# Patient Record
Sex: Female | Born: 1985
Health system: Southern US, Community
[De-identification: ages and names within clinical notes are randomized; demographics above are authoritative.]

## PROBLEM LIST (undated history)

## (undated) DIAGNOSIS — R87619 Unspecified abnormal cytological findings in specimens from cervix uteri: Secondary | ICD-10-CM

## (undated) DIAGNOSIS — R87629 Unspecified abnormal cytological findings in specimens from vagina: Secondary | ICD-10-CM

## (undated) DIAGNOSIS — IMO0002 Reserved for concepts with insufficient information to code with codable children: Secondary | ICD-10-CM

## (undated) DIAGNOSIS — B999 Unspecified infectious disease: Secondary | ICD-10-CM

## (undated) DIAGNOSIS — N63 Unspecified lump in unspecified breast: Principal | ICD-10-CM

## (undated) HISTORY — DX: Unspecified abnormal cytological findings in specimens from vagina: R87.629

## (undated) HISTORY — DX: Unspecified abnormal cytological findings in specimens from cervix uteri: R87.619

## (undated) HISTORY — DX: Unspecified lump in unspecified breast: N63.0

## (undated) HISTORY — DX: Reserved for concepts with insufficient information to code with codable children: IMO0002

## (undated) HISTORY — PX: NO PAST SURGERIES: SHX2092

---

## 2013-07-04 ENCOUNTER — Other Ambulatory Visit: Payer: Self-pay | Admitting: Obstetrics & Gynecology

## 2013-07-04 DIAGNOSIS — O3680X0 Pregnancy with inconclusive fetal viability, not applicable or unspecified: Secondary | ICD-10-CM

## 2013-07-09 ENCOUNTER — Ambulatory Visit (INDEPENDENT_AMBULATORY_CARE_PROVIDER_SITE_OTHER): Payer: BC Managed Care – PPO

## 2013-07-09 ENCOUNTER — Encounter (INDEPENDENT_AMBULATORY_CARE_PROVIDER_SITE_OTHER): Payer: Self-pay

## 2013-07-09 DIAGNOSIS — O3680X0 Pregnancy with inconclusive fetal viability, not applicable or unspecified: Secondary | ICD-10-CM

## 2013-07-09 NOTE — Progress Notes (Signed)
U/S(7+2wks)-single IUP with +FCA noted, FHR-129 bpm, cx long and closed, bilateral adnexa WNL, CRL c/w LMP dates

## 2013-07-18 ENCOUNTER — Encounter: Payer: BC Managed Care – PPO | Admitting: Advanced Practice Midwife

## 2013-07-24 ENCOUNTER — Encounter: Payer: BC Managed Care – PPO | Admitting: Advanced Practice Midwife

## 2013-07-26 ENCOUNTER — Encounter: Payer: Self-pay | Admitting: Advanced Practice Midwife

## 2013-07-26 ENCOUNTER — Ambulatory Visit (INDEPENDENT_AMBULATORY_CARE_PROVIDER_SITE_OTHER): Payer: BC Managed Care – PPO | Admitting: Advanced Practice Midwife

## 2013-07-26 ENCOUNTER — Other Ambulatory Visit (HOSPITAL_COMMUNITY)
Admission: RE | Admit: 2013-07-26 | Discharge: 2013-07-26 | Disposition: A | Discharge status: Home / Self Care | Payer: Medicaid Other | Source: Ambulatory Visit | Attending: Advanced Practice Midwife | Admitting: Advanced Practice Midwife

## 2013-07-26 VITALS — BP 110/60 | Ht 65.0 in | Wt 211.5 lb

## 2013-07-26 DIAGNOSIS — Z1151 Encounter for screening for human papillomavirus (HPV): Secondary | ICD-10-CM | POA: Insufficient documentation

## 2013-07-26 DIAGNOSIS — Z331 Pregnant state, incidental: Secondary | ICD-10-CM

## 2013-07-26 DIAGNOSIS — Z01419 Encounter for gynecological examination (general) (routine) without abnormal findings: Secondary | ICD-10-CM | POA: Insufficient documentation

## 2013-07-26 DIAGNOSIS — Z3480 Encounter for supervision of other normal pregnancy, unspecified trimester: Secondary | ICD-10-CM | POA: Insufficient documentation

## 2013-07-26 DIAGNOSIS — Z3490 Encounter for supervision of normal pregnancy, unspecified, unspecified trimester: Secondary | ICD-10-CM

## 2013-07-26 DIAGNOSIS — Z1389 Encounter for screening for other disorder: Secondary | ICD-10-CM

## 2013-07-26 DIAGNOSIS — Z3481 Encounter for supervision of other normal pregnancy, first trimester: Secondary | ICD-10-CM

## 2013-07-26 DIAGNOSIS — Z113 Encounter for screening for infections with a predominantly sexual mode of transmission: Secondary | ICD-10-CM | POA: Insufficient documentation

## 2013-07-26 LAB — CBC
MCH: 27.1 pg (ref 26.0–34.0)
MCV: 80 fL (ref 78.0–100.0)
Platelets: 332 10*3/uL (ref 150–400)
RBC: 4.46 MIL/uL (ref 3.87–5.11)
RDW: 15 % (ref 11.5–15.5)
WBC: 12.9 10*3/uL — ABNORMAL HIGH (ref 4.0–10.5)

## 2013-07-26 LAB — POCT URINALYSIS DIPSTICK
Glucose, UA: NEGATIVE
Ketones, UA: NEGATIVE

## 2013-07-26 NOTE — Progress Notes (Signed)
  Subjective:    [name] is a G2P1001 [redacted]w[redacted]d being seen today for her first obstetrical visit.  Her obstetrical history is significant for elective IOL .  Pregnancy history fully reviewed.  Patient reports backache.  Filed Vitals:   07/26/13 1351  BP: 110/60  Weight: 211 lb 8 oz (95.936 kg)    HISTORY: OB History  Gravida Para Term Preterm AB SAB TAB Ectopic Multiple Living  2 1 1       1     # Outcome Date GA Lbr Len/2nd Weight Sex Delivery Anes PTL Lv  2 CUR           1 TRM 01/12/07 [redacted]w[redacted]d  8 lb (3.629 kg) M SVD EPI  Y     Past Medical History  Diagnosis Date  . Abnormal Pap smear    Past Surgical History  Procedure Laterality Date  . No past surgeries     Family History  Problem Relation Age of Onset  . Cancer Paternal Grandmother     breast     Exam       Pelvic Exam:    Perineum: Normal Perineum   Vulva: normal   Vagina:  normal mucosa, normal discharge, no palpable nodules   Uterus         Cervix: normal   Adnexa: Not palpable   Urinary:  urethral meatus normal    System: Breast:  normal appearance, no masses or tenderness   Skin: normal coloration and turgor, no rashes    Neurologic: oriented, normal, normal mood   Extremities: normal strength, tone, and muscle mass   HEENT PERRLA   Mouth/Teeth mucous membranes moist, pharynx normal without lesions   Neck supple and no masses   Cardiovascular: regular rate and rhythm   Respiratory:  appears well, vitals normal, no respiratory distress, acyanotic, normal RR   Abdomen: soft, non-tender; bowel sounds normal; no masses,  no organomegaly          Assessment:    Pregnancy: G2P1001 Patient Active Problem List   Diagnosis Date Noted  . Pregnant 07/26/2013        Plan:     Initial labs drawn. Prenatal vitamins. Problem list reviewed and updated. Genetic Screening discussed Integrated Screen: requested.  Ultrasound discussed; fetal survey: requested.  Follow up in 3  weeks.  CRESENZO-DISHMAN,Talen Poser 07/26/2013

## 2013-07-27 LAB — OXYCODONE SCREEN, UA, RFLX CONFIRM: Oxycodone Screen, Ur: NEGATIVE ng/mL

## 2013-07-27 LAB — URINALYSIS
Glucose, UA: NEGATIVE mg/dL
Leukocytes, UA: NEGATIVE
Specific Gravity, Urine: 1.011 (ref 1.005–1.030)
pH: 6 (ref 5.0–8.0)

## 2013-07-27 LAB — DRUG SCREEN, URINE, NO CONFIRMATION
Amphetamine Screen, Ur: NEGATIVE
Barbiturate Quant, Ur: NEGATIVE
Benzodiazepines.: NEGATIVE
Cocaine Metabolites: NEGATIVE
Creatinine,U: 84.9 mg/dL
Opiate Screen, Urine: NEGATIVE
Propoxyphene: NEGATIVE

## 2013-07-27 LAB — ABO AND RH: Rh Type: POSITIVE

## 2013-07-27 LAB — SICKLE CELL SCREEN: Sickle Cell Screen: NEGATIVE

## 2013-07-27 LAB — ANTIBODY SCREEN: Antibody Screen: NEGATIVE

## 2013-07-27 LAB — HEPATITIS B SURFACE ANTIGEN: Hepatitis B Surface Ag: NEGATIVE

## 2013-07-27 LAB — RPR

## 2013-07-28 LAB — URINE CULTURE: Colony Count: 30000

## 2013-08-07 ENCOUNTER — Ambulatory Visit (INDEPENDENT_AMBULATORY_CARE_PROVIDER_SITE_OTHER): Payer: BC Managed Care – PPO | Admitting: Obstetrics & Gynecology

## 2013-08-07 ENCOUNTER — Encounter: Payer: Self-pay | Admitting: Obstetrics & Gynecology

## 2013-08-07 ENCOUNTER — Telehealth: Payer: Self-pay | Admitting: Obstetrics & Gynecology

## 2013-08-07 VITALS — BP 90/60 | Wt 213.5 lb

## 2013-08-07 DIAGNOSIS — Z331 Pregnant state, incidental: Secondary | ICD-10-CM

## 2013-08-07 DIAGNOSIS — B373 Candidiasis of vulva and vagina: Secondary | ICD-10-CM

## 2013-08-07 DIAGNOSIS — Z1389 Encounter for screening for other disorder: Secondary | ICD-10-CM

## 2013-08-07 DIAGNOSIS — O2390 Unspecified genitourinary tract infection in pregnancy, unspecified trimester: Secondary | ICD-10-CM

## 2013-08-07 DIAGNOSIS — B3731 Acute candidiasis of vulva and vagina: Secondary | ICD-10-CM

## 2013-08-07 LAB — POCT URINALYSIS DIPSTICK
Glucose, UA: NEGATIVE
Protein, UA: NEGATIVE

## 2013-08-07 MED ORDER — TERCONAZOLE 0.4 % VA CREA: 1.0000 | TOPICAL_CREAM | Freq: Every day | VAGINAL | Status: DC

## 2013-08-07 NOTE — Progress Notes (Signed)
+   yeast, has had them before Terazol 7 qhs x 7

## 2013-08-09 NOTE — L&D Delivery Note (Signed)
Delivery Note At 4:13 PM a viable and healthy female was delivered via Vaginal, Spontaneous Delivery (Presentation: Left Occiput Anterior), compound hand.  APGAR: 8, 9; weight pending.   Placenta status: Intact, Spontaneous.  Cord: 3 vessels with the following complications: None.  Cord pH: NA  Anesthesia: Epidural  Episiotomy: None Lacerations: superficial right labial, hemostatic Suture Repair: NA Est. Blood Loss (mL): 250 ml  Mom to postpartum.  Baby to Couplet care / Skin to Skin. Placenta to: BS Feeding: Br/Bo Circ: NA Contraception: Adrian ProwsMirena  Emanuel Dowson, Koleen NimrodVIRGINIA 01/20/2014, 4:33 PM

## 2013-08-16 ENCOUNTER — Other Ambulatory Visit: Payer: Self-pay | Admitting: Advanced Practice Midwife

## 2013-08-16 ENCOUNTER — Encounter (INDEPENDENT_AMBULATORY_CARE_PROVIDER_SITE_OTHER): Payer: Self-pay

## 2013-08-16 ENCOUNTER — Ambulatory Visit (INDEPENDENT_AMBULATORY_CARE_PROVIDER_SITE_OTHER): Payer: BC Managed Care – PPO | Admitting: Advanced Practice Midwife

## 2013-08-16 ENCOUNTER — Ambulatory Visit (INDEPENDENT_AMBULATORY_CARE_PROVIDER_SITE_OTHER): Payer: BC Managed Care – PPO

## 2013-08-16 ENCOUNTER — Encounter: Payer: Self-pay | Admitting: Advanced Practice Midwife

## 2013-08-16 VITALS — BP 116/60 | Wt 212.0 lb

## 2013-08-16 DIAGNOSIS — Z1389 Encounter for screening for other disorder: Secondary | ICD-10-CM

## 2013-08-16 DIAGNOSIS — Z3481 Encounter for supervision of other normal pregnancy, first trimester: Secondary | ICD-10-CM

## 2013-08-16 DIAGNOSIS — Z331 Pregnant state, incidental: Secondary | ICD-10-CM

## 2013-08-16 DIAGNOSIS — Z3480 Encounter for supervision of other normal pregnancy, unspecified trimester: Secondary | ICD-10-CM

## 2013-08-16 DIAGNOSIS — Z3490 Encounter for supervision of normal pregnancy, unspecified, unspecified trimester: Secondary | ICD-10-CM

## 2013-08-16 DIAGNOSIS — Z36 Encounter for antenatal screening of mother: Secondary | ICD-10-CM

## 2013-08-16 LAB — POCT URINALYSIS DIPSTICK
Glucose, UA: NEGATIVE
Ketones, UA: NEGATIVE
LEUKOCYTES UA: NEGATIVE
Nitrite, UA: NEGATIVE
PROTEIN UA: NEGATIVE

## 2013-08-16 NOTE — Progress Notes (Signed)
U/S(12+5wks)-single IUP with +FCA noted, FHR-138 bpm, cx appears long and closed (3.5cm), bilateral adnexa appears WNL, CRL c/w dates, NB present, NT-1.8347mm, posterior Gr 0 placenta noted

## 2013-08-16 NOTE — Progress Notes (Signed)
Had NT/IT today.    No c/o at this time.  Routine questions about pregnancy answered.  F/U in 4 weeks for LROB/2nd IT.

## 2013-08-21 LAB — MATERNAL SCREEN, INTEGRATED #1

## 2013-09-03 ENCOUNTER — Telehealth: Payer: Self-pay | Admitting: Obstetrics & Gynecology

## 2013-09-03 MED ORDER — PRENATAL PLUS 27-1 MG PO TABS: ORAL_TABLET | ORAL | Status: DC

## 2013-09-03 NOTE — Telephone Encounter (Signed)
Pt informed prenatal vitamins e-scribed.

## 2013-09-12 ENCOUNTER — Ambulatory Visit (INDEPENDENT_AMBULATORY_CARE_PROVIDER_SITE_OTHER): Payer: BC Managed Care – PPO | Admitting: Women's Health

## 2013-09-12 ENCOUNTER — Encounter: Payer: Self-pay | Admitting: Women's Health

## 2013-09-12 ENCOUNTER — Encounter (INDEPENDENT_AMBULATORY_CARE_PROVIDER_SITE_OTHER): Payer: Self-pay

## 2013-09-12 ENCOUNTER — Other Ambulatory Visit: Payer: Self-pay | Admitting: Obstetrics & Gynecology

## 2013-09-12 VITALS — BP 126/62 | Wt 214.0 lb

## 2013-09-12 DIAGNOSIS — Z1389 Encounter for screening for other disorder: Secondary | ICD-10-CM

## 2013-09-12 DIAGNOSIS — Z3480 Encounter for supervision of other normal pregnancy, unspecified trimester: Secondary | ICD-10-CM

## 2013-09-12 DIAGNOSIS — Z331 Pregnant state, incidental: Secondary | ICD-10-CM

## 2013-09-12 LAB — POCT URINALYSIS DIPSTICK
Glucose, UA: NEGATIVE
Ketones, UA: NEGATIVE
Leukocytes, UA: NEGATIVE
NITRITE UA: NEGATIVE
Protein, UA: NEGATIVE
RBC UA: NEGATIVE

## 2013-09-12 NOTE — Patient Instructions (Signed)
[place] Pediatricians:  [facility] [phone]            [facility] [phone]                 [facility] [phone] (usually doesn't accept new patients unless you have family there already, you are always welcome to call and ask)             [facility] ([address]) [phone]   [facility] Pediatricians:   [facility]: [phone]  [facility]: [phone]   Second Trimester of Pregnancy The second trimester is from week 13 through week 28, months 4 through 6. The second trimester is often a time when you feel your best. Your body has also adjusted to being pregnant, and you begin to feel better physically. Usually, morning sickness has lessened or quit completely, you may have more energy, and you may have an increase in appetite. The second trimester is also a time when the fetus is growing rapidly. At the end of the sixth month, the fetus is about 9 inches long and weighs about 1 pounds. You will likely begin to feel the baby move (quickening) between 18 and 20 weeks of the pregnancy. BODY CHANGES Your body goes through many changes during pregnancy. The changes vary from woman to woman.   Your weight will continue to increase. You will notice your lower abdomen bulging out.  You may begin to get stretch marks on your hips, abdomen, and breasts.  You may develop headaches that can be relieved by medicines approved by your caregiver.  You may urinate more often because the fetus is pressing on your bladder.  You may develop or continue to have heartburn as a result of your pregnancy.  You may develop constipation because certain hormones are causing the muscles that push waste through your intestines to slow down.  You may develop hemorrhoids or swollen, bulging veins (varicose veins).  You may have back pain because of the weight gain and pregnancy  hormones relaxing your joints between the bones in your pelvis and as a result of a shift in weight and the muscles that support your balance.  Your breasts will continue to grow and be tender.  Your gums may bleed and may be sensitive to brushing and flossing.  Dark spots or blotches (chloasma, mask of pregnancy) may develop on your face. This will likely fade after the baby is born.  A dark line from your belly button to the pubic area (linea nigra) may appear. This will likely fade after the baby is born. WHAT TO EXPECT AT YOUR PRENATAL VISITS During a routine prenatal visit:  You will be weighed to make sure you and the fetus are growing normally.  Your blood pressure will be taken.  Your abdomen will be measured to track your baby's growth.  The fetal heartbeat will be listened to.  Any test results from the previous visit will be discussed. Your caregiver may ask you:  How you are feeling.  If you are feeling the baby move.  If you have had any abnormal symptoms, such as leaking fluid, bleeding, severe headaches, or abdominal cramping.  If you have any questions. Other tests that may be performed during your second trimester include:  Blood tests that check for:  Low iron levels (anemia).  Gestational diabetes (between 24 and 28 weeks).  Rh antibodies.  Urine tests to check for infections, diabetes, or protein  in the urine.  An ultrasound to confirm the proper growth and development of the baby.  An amniocentesis to check for possible genetic problems.  Fetal screens for spina bifida and Down syndrome. HOME CARE INSTRUCTIONS   Avoid all smoking, herbs, alcohol, and unprescribed drugs. These chemicals affect the formation and growth of the baby.  Follow your caregiver's instructions regarding medicine use. There are medicines that are either safe or unsafe to take during pregnancy.  Exercise only as directed by your caregiver. Experiencing uterine cramps is a  good sign to stop exercising.  Continue to eat regular, healthy meals.  Wear a good support bra for breast tenderness.  Do not use hot tubs, steam rooms, or saunas.  Wear your seat belt at all times when driving.  Avoid raw meat, uncooked cheese, cat litter boxes, and soil used by cats. These carry germs that can cause birth defects in the baby.  Take your prenatal vitamins.  Try taking a stool softener (if your caregiver approves) if you develop constipation. Eat more high-fiber foods, such as fresh vegetables or fruit and whole grains. Drink plenty of fluids to keep your urine clear or pale yellow.  Take warm sitz baths to soothe any pain or discomfort caused by hemorrhoids. Use hemorrhoid cream if your caregiver approves.  If you develop varicose veins, wear support hose. Elevate your feet for 15 minutes, 3 4 times a day. Limit salt in your diet.  Avoid heavy lifting, wear low heel shoes, and practice good posture.  Rest with your legs elevated if you have leg cramps or low back pain.  Visit your dentist if you have not gone yet during your pregnancy. Use a soft toothbrush to brush your teeth and be gentle when you floss.  A sexual relationship may be continued unless your caregiver directs you otherwise.  Continue to go to all your prenatal visits as directed by your caregiver. SEEK MEDICAL CARE IF:   You have dizziness.  You have mild pelvic cramps, pelvic pressure, or nagging pain in the abdominal area.  You have persistent nausea, vomiting, or diarrhea.  You have a bad smelling vaginal discharge.  You have pain with urination. SEEK IMMEDIATE MEDICAL CARE IF:   You have a fever.  You are leaking fluid from your vagina.  You have spotting or bleeding from your vagina.  You have severe abdominal cramping or pain.  You have rapid weight gain or loss.  You have shortness of breath with chest pain.  You notice sudden or extreme swelling of your face, hands,  ankles, feet, or legs.  You have not felt your baby move in over an hour.  You have severe headaches that do not go away with medicine.  You have vision changes. Document Released: 07/20/2001 Document Revised: 03/28/2013 Document Reviewed: 09/26/2012 [facility] Patient Information 2014 [place], [place].

## 2013-09-12 NOTE — Progress Notes (Signed)
Denies cramping, lof, vb, uti s/s.  Feeling some flutters. Daily ha's x 1 week, reviewed prevention/relief measures. To let us know if not helping, or ha's getting worse.  Reviewed warning s/s to report.  All questions answered. F/U in 4wks for anatomy u/s and visit. 2nd IT today.

## 2013-09-15 LAB — MATERNAL SCREEN, INTEGRATED #2
AFP MoM: 1.08
AFP, SERUM MAT SCREEN: 34 ng/mL
Calculated Gestational Age: 16.4
Crown Rump Length: 63.5 mm
Estriol Mom: 0.75
Estriol, Free: 0.6 ng/mL
HCG, MOM MAT SCREEN: 0.53
INHIBIN A MOM MAT SCREEN: 0.62
Inhibin A Dimeric: 89 pg/mL
MSS Down Syndrome: <1:5000 {titer}
MSS Trisomy 18 Risk: <1:5000 {titer}
NT MOM MAT SCREEN: 1.02
Nuchal Translucency: 1.47 mm
Number of fetuses: 1
PAPP-A MoM: 0.32
PAPP-A: 304 ng/mL
Rish for ONTD: <1:5000 {titer}
hCG, Serum: 17 IU/mL

## 2013-10-10 ENCOUNTER — Other Ambulatory Visit: Payer: BC Managed Care – PPO

## 2013-10-10 ENCOUNTER — Encounter: Payer: BC Managed Care – PPO | Admitting: Women's Health

## 2013-10-17 ENCOUNTER — Other Ambulatory Visit: Payer: Self-pay | Admitting: Women's Health

## 2013-10-17 ENCOUNTER — Ambulatory Visit (INDEPENDENT_AMBULATORY_CARE_PROVIDER_SITE_OTHER): Payer: BC Managed Care – PPO

## 2013-10-17 ENCOUNTER — Ambulatory Visit (INDEPENDENT_AMBULATORY_CARE_PROVIDER_SITE_OTHER): Payer: BC Managed Care – PPO | Admitting: Obstetrics and Gynecology

## 2013-10-17 VITALS — BP 120/72 | Wt 218.0 lb

## 2013-10-17 DIAGNOSIS — Z331 Pregnant state, incidental: Secondary | ICD-10-CM

## 2013-10-17 DIAGNOSIS — Z1389 Encounter for screening for other disorder: Secondary | ICD-10-CM

## 2013-10-17 DIAGNOSIS — Z3480 Encounter for supervision of other normal pregnancy, unspecified trimester: Secondary | ICD-10-CM

## 2013-10-17 DIAGNOSIS — Z3490 Encounter for supervision of normal pregnancy, unspecified, unspecified trimester: Secondary | ICD-10-CM

## 2013-10-17 LAB — POCT URINALYSIS DIPSTICK
Blood, UA: NEGATIVE
Glucose, UA: NEGATIVE
Ketones, UA: NEGATIVE
LEUKOCYTES UA: NEGATIVE
NITRITE UA: NEGATIVE
Protein, UA: NEGATIVE

## 2013-10-17 NOTE — Progress Notes (Signed)
2118w4d. G2P1. Fundal height is U+2. No complications.   U/S(21+4wks)-vtx active fetus, meas c/w dates, EFW 15 oz, FHR- 144 bpm, cx appears closed (3.9cm), bilateral adnexa WNL, female fetus, no obvious abnl noted although unable to view the cardiac OFT's due to fetal position, posterior Gr 0 placenta noted, would like to reck anatomy @ ~28 weeks  This chart was scribed by Bennett Scrapehristina Taylor, Medical Scribe, for Dr. Christin BachJohn Labradford Schnitker on 10/17/13 at 2:39 PM. This chart was reviewed by Dr. Christin BachJohn Xian Apostol and is accurate.

## 2013-10-17 NOTE — Progress Notes (Signed)
Pt states that she has been having headaches, the other day had one that lasted all day. Pt denies any other problems or concerns, no blurred vision.

## 2013-10-17 NOTE — Progress Notes (Signed)
U/S(21+4wks)-vtx active fetus, meas c/w dates, EFW 15 oz, FHR- 144 bpm, cx appears closed (3.9cm), bilateral adnexa WNL, female fetus, no obvious abnl noted although unable to view the cardiac OFT's due to fetal position, posterior Gr 0 placenta noted, would like to reck anatomy @ ~28 weeks

## 2013-10-23 ENCOUNTER — Encounter: Payer: Self-pay | Admitting: Women's Health

## 2013-11-08 ENCOUNTER — Other Ambulatory Visit: Payer: Self-pay | Admitting: Obstetrics and Gynecology

## 2013-11-08 DIAGNOSIS — O358XX0 Maternal care for other (suspected) fetal abnormality and damage, not applicable or unspecified: Secondary | ICD-10-CM

## 2013-11-14 ENCOUNTER — Ambulatory Visit (INDEPENDENT_AMBULATORY_CARE_PROVIDER_SITE_OTHER): Payer: BC Managed Care – PPO | Admitting: Women's Health

## 2013-11-14 ENCOUNTER — Encounter: Payer: Self-pay | Admitting: Women's Health

## 2013-11-14 ENCOUNTER — Ambulatory Visit (INDEPENDENT_AMBULATORY_CARE_PROVIDER_SITE_OTHER): Payer: BC Managed Care – PPO

## 2013-11-14 VITALS — BP 108/50 | Wt 223.0 lb

## 2013-11-14 DIAGNOSIS — N9089 Other specified noninflammatory disorders of vulva and perineum: Secondary | ICD-10-CM

## 2013-11-14 DIAGNOSIS — Z1389 Encounter for screening for other disorder: Secondary | ICD-10-CM

## 2013-11-14 DIAGNOSIS — O358XX0 Maternal care for other (suspected) fetal abnormality and damage, not applicable or unspecified: Secondary | ICD-10-CM

## 2013-11-14 DIAGNOSIS — Z3480 Encounter for supervision of other normal pregnancy, unspecified trimester: Secondary | ICD-10-CM

## 2013-11-14 DIAGNOSIS — O2390 Unspecified genitourinary tract infection in pregnancy, unspecified trimester: Secondary | ICD-10-CM

## 2013-11-14 DIAGNOSIS — N898 Other specified noninflammatory disorders of vagina: Secondary | ICD-10-CM

## 2013-11-14 DIAGNOSIS — O26899 Other specified pregnancy related conditions, unspecified trimester: Secondary | ICD-10-CM

## 2013-11-14 DIAGNOSIS — Z331 Pregnant state, incidental: Secondary | ICD-10-CM

## 2013-11-14 LAB — POCT WET PREP (WET MOUNT): Clue Cells Wet Prep Whiff POC: NEGATIVE

## 2013-11-14 LAB — POCT URINALYSIS DIPSTICK
Glucose, UA: NEGATIVE
Ketones, UA: NEGATIVE
Nitrite, UA: NEGATIVE
Protein, UA: NEGATIVE
RBC UA: NEGATIVE

## 2013-11-14 LAB — OB RESULTS CONSOLE GC/CHLAMYDIA
CHLAMYDIA, DNA PROBE: NEGATIVE
Chlamydia: NEGATIVE
Chlamydia: NEGATIVE
GC PROBE AMP, GENITAL: NEGATIVE
Gonorrhea: NEGATIVE
Gonorrhea: NEGATIVE

## 2013-11-14 MED ORDER — NYSTATIN-TRIAMCINOLONE 100000-0.1 UNIT/GM-% EX OINT
1.0000 | TOPICAL_OINTMENT | Freq: Two times a day (BID) | CUTANEOUS | Status: DC
Start: 2013-11-14 — End: 2014-01-16

## 2013-11-14 NOTE — Patient Instructions (Signed)
You will have your sugar test next visit.  Please do not eat or drink anything after midnight the night before you come, not even water.  You will be here for at least two hours.     Second Trimester of Pregnancy The second trimester is from week 13 through week 28, months 4 through 6. The second trimester is often a time when you feel your best. Your body has also adjusted to being pregnant, and you begin to feel better physically. Usually, morning sickness has lessened or quit completely, you may have more energy, and you may have an increase in appetite. The second trimester is also a time when the fetus is growing rapidly. At the end of the sixth month, the fetus is about 9 inches long and weighs about 1 pounds. You will likely begin to feel the baby move (quickening) between 18 and 20 weeks of the pregnancy. BODY CHANGES Your body goes through many changes during pregnancy. The changes vary from woman to woman.   Your weight will continue to increase. You will notice your lower abdomen bulging out.  You may begin to get stretch marks on your hips, abdomen, and breasts.  You may develop headaches that can be relieved by medicines approved by your caregiver.  You may urinate more often because the fetus is pressing on your bladder.  You may develop or continue to have heartburn as a result of your pregnancy.  You may develop constipation because certain hormones are causing the muscles that push waste through your intestines to slow down.  You may develop hemorrhoids or swollen, bulging veins (varicose veins).  You may have back pain because of the weight gain and pregnancy hormones relaxing your joints between the bones in your pelvis and as a result of a shift in weight and the muscles that support your balance.  Your breasts will continue to grow and be tender.  Your gums may bleed and may be sensitive to brushing and flossing.  Dark spots or blotches (chloasma, mask of pregnancy)  may develop on your face. This will likely fade after the baby is born.  A dark line from your belly button to the pubic area (linea nigra) may appear. This will likely fade after the baby is born. WHAT TO EXPECT AT YOUR PRENATAL VISITS During a routine prenatal visit:  You will be weighed to make sure you and the fetus are growing normally.  Your blood pressure will be taken.  Your abdomen will be measured to track your baby's growth.  The fetal heartbeat will be listened to.  Any test results from the previous visit will be discussed. Your caregiver may ask you:  How you are feeling.  If you are feeling the baby move.  If you have had any abnormal symptoms, such as leaking fluid, bleeding, severe headaches, or abdominal cramping.  If you have any questions. Other tests that may be performed during your second trimester include:  Blood tests that check for:  Low iron levels (anemia).  Gestational diabetes (between 24 and 28 weeks).  Rh antibodies.  Urine tests to check for infections, diabetes, or protein in the urine.  An ultrasound to confirm the proper growth and development of the baby.  An amniocentesis to check for possible genetic problems.  Fetal screens for spina bifida and Down syndrome. HOME CARE INSTRUCTIONS   Avoid all smoking, herbs, alcohol, and unprescribed drugs. These chemicals affect the formation and growth of the baby.  Follow your caregiver's   instructions regarding medicine use. There are medicines that are either safe or unsafe to take during pregnancy.  Exercise only as directed by your caregiver. Experiencing uterine cramps is a good sign to stop exercising.  Continue to eat regular, healthy meals.  Wear a good support bra for breast tenderness.  Do not use hot tubs, steam rooms, or saunas.  Wear your seat belt at all times when driving.  Avoid raw meat, uncooked cheese, cat litter boxes, and soil used by cats. These carry germs that  can cause birth defects in the baby.  Take your prenatal vitamins.  Try taking a stool softener (if your caregiver approves) if you develop constipation. Eat more high-fiber foods, such as fresh vegetables or fruit and whole grains. Drink plenty of fluids to keep your urine clear or pale yellow.  Take warm sitz baths to soothe any pain or discomfort caused by hemorrhoids. Use hemorrhoid cream if your caregiver approves.  If you develop varicose veins, wear support hose. Elevate your feet for 15 minutes, 3 4 times a day. Limit salt in your diet.  Avoid heavy lifting, wear low heel shoes, and practice good posture.  Rest with your legs elevated if you have leg cramps or low back pain.  Visit your dentist if you have not gone yet during your pregnancy. Use a soft toothbrush to brush your teeth and be gentle when you floss.  A sexual relationship may be continued unless your caregiver directs you otherwise.  Continue to go to all your prenatal visits as directed by your caregiver. SEEK MEDICAL CARE IF:   You have dizziness.  You have mild pelvic cramps, pelvic pressure, or nagging pain in the abdominal area.  You have persistent nausea, vomiting, or diarrhea.  You have a bad smelling vaginal discharge.  You have pain with urination. SEEK IMMEDIATE MEDICAL CARE IF:   You have a fever.  You are leaking fluid from your vagina.  You have spotting or bleeding from your vagina.  You have severe abdominal cramping or pain.  You have rapid weight gain or loss.  You have shortness of breath with chest pain.  You notice sudden or extreme swelling of your face, hands, ankles, feet, or legs.  You have not felt your baby move in over an hour.  You have severe headaches that do not go away with medicine.  You have vision changes. Document Released: 07/20/2001 Document Revised: 03/28/2013 Document Reviewed: 09/26/2012 [employer] Patient Information 2014 [employer].  

## 2013-11-14 NOTE — Progress Notes (Signed)
Reports good fm. Denies uc's, lof, vb, uti s/s.  Vulvar itching/irritation/soreness x 2-3d, had changed detergents. Yellowish nonodorous d/c today. Spec exam: cx visually closed, thin yellow nonodorous d/c, wet prep: many wbc's, no yeast/trich/clues.  Will send urine for gc/ch, rx mytrex. Cardiac OFTs seen on today's u/s. Reviewed ptl s/s, fm.  All questions answered. F/U in 3wks for pn2 and visit.

## 2013-11-14 NOTE — Progress Notes (Signed)
U/S(25+4wks)-vtx active fetus, FHR-146 bpm, posterior Gr 0 placenta, cx appears closed(3.3cm), fluid WNL, cardiac OFT's noted on today's exam, approp growth EFW 1 lb 9 oz (32nd%tile), sub-optimal due to baby positioned face posterior

## 2013-11-16 LAB — GC/CHLAMYDIA PROBE AMP
CT Probe RNA: NEGATIVE
GC PROBE AMP APTIMA: NEGATIVE

## 2013-11-19 ENCOUNTER — Telehealth: Payer: Self-pay | Admitting: Women's Health

## 2013-11-19 NOTE — Telephone Encounter (Signed)
Notified pt GC/CH neg. States vulvar irritation and d/c cleared up over weekend.  Cheral MarkerKimberly R. Booker, CNM, [facility] CenterWHNP-BC 11/19/2013 5:28 PM

## 2013-12-05 ENCOUNTER — Encounter: Payer: Self-pay | Admitting: Women's Health

## 2013-12-05 ENCOUNTER — Other Ambulatory Visit: Payer: BC Managed Care – PPO

## 2013-12-05 ENCOUNTER — Ambulatory Visit (INDEPENDENT_AMBULATORY_CARE_PROVIDER_SITE_OTHER): Payer: BC Managed Care – PPO | Admitting: Women's Health

## 2013-12-05 VITALS — BP 110/60 | Wt 222.0 lb

## 2013-12-05 DIAGNOSIS — L299 Pruritus, unspecified: Secondary | ICD-10-CM

## 2013-12-05 DIAGNOSIS — O9989 Other specified diseases and conditions complicating pregnancy, childbirth and the puerperium: Secondary | ICD-10-CM

## 2013-12-05 DIAGNOSIS — O99019 Anemia complicating pregnancy, unspecified trimester: Secondary | ICD-10-CM

## 2013-12-05 DIAGNOSIS — Z331 Pregnant state, incidental: Secondary | ICD-10-CM

## 2013-12-05 DIAGNOSIS — O358XX0 Maternal care for other (suspected) fetal abnormality and damage, not applicable or unspecified: Secondary | ICD-10-CM

## 2013-12-05 DIAGNOSIS — Z3480 Encounter for supervision of other normal pregnancy, unspecified trimester: Secondary | ICD-10-CM

## 2013-12-05 DIAGNOSIS — Z1389 Encounter for screening for other disorder: Secondary | ICD-10-CM

## 2013-12-05 LAB — POCT URINALYSIS DIPSTICK
Blood, UA: NEGATIVE
GLUCOSE UA: NEGATIVE
Ketones, UA: NEGATIVE
Leukocytes, UA: NEGATIVE
NITRITE UA: NEGATIVE
Protein, UA: NEGATIVE

## 2013-12-05 LAB — CBC
HCT: 32.2 % — ABNORMAL LOW (ref 36.0–46.0)
Hemoglobin: 11 g/dL — ABNORMAL LOW (ref 12.0–15.0)
MCH: 25.8 pg — AB (ref 26.0–34.0)
MCHC: 34.2 g/dL (ref 30.0–36.0)
MCV: 75.4 fL — AB (ref 78.0–100.0)
Platelets: 287 10*3/uL (ref 150–400)
RBC: 4.27 MIL/uL (ref 3.87–5.11)
RDW: 14.6 % (ref 11.5–15.5)
WBC: 10.4 10*3/uL (ref 4.0–10.5)

## 2013-12-05 NOTE — Progress Notes (Signed)
Reports good fm. Denies uc's, lof, vb, uti s/s.  Generalized pruritus x 2wks, worse at night. Will get bile acids.  Reviewed ptl s/s, fkc.  All questions answered. F/U in 4wks for visit.  PN2 today.

## 2013-12-05 NOTE — Patient Instructions (Signed)
Third Trimester of Pregnancy  The third trimester is from week 29 through week 42, months 7 through 9. The third trimester is a time when the fetus is growing rapidly. At the end of the ninth month, the fetus is about 20 inches in length and weighs 6 10 pounds.   BODY CHANGES  Your body goes through many changes during pregnancy. The changes vary from woman to woman.    Your weight will continue to increase. You can expect to gain 25 35 pounds (11 16 kg) by the end of the pregnancy.   You may begin to get stretch marks on your hips, abdomen, and breasts.   You may urinate more often because the fetus is moving lower into your pelvis and pressing on your bladder.   You may develop or continue to have heartburn as a result of your pregnancy.   You may develop constipation because certain hormones are causing the muscles that push waste through your intestines to slow down.   You may develop hemorrhoids or swollen, bulging veins (varicose veins).   You may have pelvic pain because of the weight gain and pregnancy hormones relaxing your joints between the bones in your pelvis. Back aches may result from over exertion of the muscles supporting your posture.   Your breasts will continue to grow and be tender. A yellow discharge may leak from your breasts called colostrum.   Your belly button may stick out.   You may feel short of breath because of your expanding uterus.   You may notice the fetus "dropping," or moving lower in your abdomen.   You may have a bloody mucus discharge. This usually occurs a few days to a week before labor begins.   Your cervix becomes thin and soft (effaced) near your due date.  WHAT TO EXPECT AT YOUR PRENATAL EXAMS   You will have prenatal exams every 2 weeks until week 36. Then, you will have weekly prenatal exams. During a routine prenatal visit:   You will be weighed to make sure you and the fetus are growing normally.   Your blood pressure is taken.   Your abdomen will be  measured to track your baby's growth.   The fetal heartbeat will be listened to.   Any test results from the previous visit will be discussed.   You may have a cervical check near your due date to see if you have effaced.  At around 36 weeks, your caregiver will check your cervix. At the same time, your caregiver will also perform a test on the secretions of the vaginal tissue. This test is to determine if a type of bacteria, Group B streptococcus, is present. Your caregiver will explain this further.  Your caregiver may ask you:   What your birth plan is.   How you are feeling.   If you are feeling the baby move.   If you have had any abnormal symptoms, such as leaking fluid, bleeding, severe headaches, or abdominal cramping.   If you have any questions.  Other tests or screenings that may be performed during your third trimester include:   Blood tests that check for low iron levels (anemia).   Fetal testing to check the health, activity level, and growth of the fetus. Testing is done if you have certain medical conditions or if there are problems during the pregnancy.  FALSE LABOR  You may feel small, irregular contractions that eventually go away. These are called Braxton Hicks contractions, or   false labor. Contractions may last for hours, days, or even weeks before true labor sets in. If contractions come at regular intervals, intensify, or become painful, it is best to be seen by your caregiver.   SIGNS OF LABOR    Menstrual-like cramps.   Contractions that are 5 minutes apart or less.   Contractions that start on the top of the uterus and spread down to the lower abdomen and back.   A sense of increased pelvic pressure or back pain.   A watery or bloody mucus discharge that comes from the vagina.  If you have any of these signs before the 37th week of pregnancy, call your caregiver right away. You need to go to the hospital to get checked immediately.  HOME CARE INSTRUCTIONS    Avoid all  smoking, herbs, alcohol, and unprescribed drugs. These chemicals affect the formation and growth of the baby.   Follow your caregiver's instructions regarding medicine use. There are medicines that are either safe or unsafe to take during pregnancy.   Exercise only as directed by your caregiver. Experiencing uterine cramps is a good sign to stop exercising.   Continue to eat regular, healthy meals.   Wear a good support bra for breast tenderness.   Do not use hot tubs, steam rooms, or saunas.   Wear your seat belt at all times when driving.   Avoid raw meat, uncooked cheese, cat litter boxes, and soil used by cats. These carry germs that can cause birth defects in the baby.   Take your prenatal vitamins.   Try taking a stool softener (if your caregiver approves) if you develop constipation. Eat more high-fiber foods, such as fresh vegetables or fruit and whole grains. Drink plenty of fluids to keep your urine clear or pale yellow.   Take warm sitz baths to soothe any pain or discomfort caused by hemorrhoids. Use hemorrhoid cream if your caregiver approves.   If you develop varicose veins, wear support hose. Elevate your feet for 15 minutes, 3 4 times a day. Limit salt in your diet.   Avoid heavy lifting, wear low heal shoes, and practice good posture.   Rest a lot with your legs elevated if you have leg cramps or low back pain.   Visit your dentist if you have not gone during your pregnancy. Use a soft toothbrush to brush your teeth and be gentle when you floss.   A sexual relationship may be continued unless your caregiver directs you otherwise.   Do not travel far distances unless it is absolutely necessary and only with the approval of your caregiver.   Take prenatal classes to understand, practice, and ask questions about the labor and delivery.   Make a trial run to the hospital.   Pack your hospital bag.   Prepare the baby's nursery.   Continue to go to all your prenatal visits as directed  by your caregiver.  SEEK MEDICAL CARE IF:   You are unsure if you are in labor or if your water has broken.   You have dizziness.   You have mild pelvic cramps, pelvic pressure, or nagging pain in your abdominal area.   You have persistent nausea, vomiting, or diarrhea.   You have a bad smelling vaginal discharge.   You have pain with urination.  SEEK IMMEDIATE MEDICAL CARE IF:    You have a fever.   You are leaking fluid from your vagina.   You have spotting or bleeding from your vagina.     You have severe abdominal cramping or pain.   You have rapid weight loss or gain.   You have shortness of breath with chest pain.   You notice sudden or extreme swelling of your face, hands, ankles, feet, or legs.   You have not felt your baby move in over an hour.   You have severe headaches that do not go away with medicine.   You have vision changes.  Document Released: 07/20/2001 Document Revised: 03/28/2013 Document Reviewed: 09/26/2012  [employer] Patient Information 2014 [employer].

## 2013-12-05 NOTE — Addendum Note (Signed)
Addended by: Colen DarlingYOUNG, Nazariah Cadet S on: 12/05/2013 09:54 AM   Modules accepted: Orders

## 2013-12-06 LAB — ANTIBODY SCREEN: Antibody Screen: NEGATIVE

## 2013-12-06 LAB — RPR

## 2013-12-06 LAB — HSV 2 ANTIBODY, IGG: HSV 2 Glycoprotein G Ab, IgG: 9.9 IV — ABNORMAL HIGH

## 2013-12-06 LAB — GLUCOSE TOLERANCE, 2 HOURS W/ 1HR
GLUCOSE: 119 mg/dL (ref 70–170)
Glucose, 2 hour: 80 mg/dL (ref 70–139)
Glucose, Fasting: 80 mg/dL (ref 70–99)

## 2013-12-06 LAB — HIV ANTIBODY (ROUTINE TESTING W REFLEX): HIV: NONREACTIVE

## 2013-12-08 ENCOUNTER — Encounter: Payer: Self-pay | Admitting: Women's Health

## 2013-12-08 DIAGNOSIS — R768 Other specified abnormal immunological findings in serum: Secondary | ICD-10-CM | POA: Insufficient documentation

## 2013-12-14 ENCOUNTER — Other Ambulatory Visit: Payer: BC Managed Care – PPO

## 2013-12-14 ENCOUNTER — Telehealth: Payer: Self-pay | Admitting: Women's Health

## 2013-12-14 DIAGNOSIS — L299 Pruritus, unspecified: Secondary | ICD-10-CM

## 2013-12-15 LAB — BILE ACIDS, TOTAL: BILE ACIDS TOTAL: 10 umol/L (ref 0–19)

## 2013-12-17 ENCOUNTER — Telehealth: Payer: Self-pay | Admitting: Women's Health

## 2013-12-17 NOTE — Telephone Encounter (Signed)
LM for pt to return call.  Cheral MarkerKimberly R. Josephus Harriger, CNM, [facility] 12/17/2013

## 2013-12-17 NOTE — Telephone Encounter (Signed)
Pt returned call, spoke to her and answered questions regarding HSV2 dx.  Cheral MarkerKimberly R. Mikele Sifuentes, CNM, [facility] 12/17/2013 2:35 PM

## 2014-01-02 ENCOUNTER — Encounter: Payer: Self-pay | Admitting: Obstetrics and Gynecology

## 2014-01-02 ENCOUNTER — Ambulatory Visit (INDEPENDENT_AMBULATORY_CARE_PROVIDER_SITE_OTHER): Payer: BC Managed Care – PPO | Admitting: Obstetrics and Gynecology

## 2014-01-02 ENCOUNTER — Encounter: Payer: BC Managed Care – PPO | Admitting: Women's Health

## 2014-01-02 VITALS — BP 120/76 | Wt 227.0 lb

## 2014-01-02 DIAGNOSIS — Z3480 Encounter for supervision of other normal pregnancy, unspecified trimester: Secondary | ICD-10-CM

## 2014-01-02 DIAGNOSIS — Z3490 Encounter for supervision of normal pregnancy, unspecified, unspecified trimester: Secondary | ICD-10-CM

## 2014-01-02 DIAGNOSIS — R768 Other specified abnormal immunological findings in serum: Secondary | ICD-10-CM

## 2014-01-02 DIAGNOSIS — Z331 Pregnant state, incidental: Secondary | ICD-10-CM

## 2014-01-02 DIAGNOSIS — Z1389 Encounter for screening for other disorder: Secondary | ICD-10-CM

## 2014-01-02 DIAGNOSIS — R894 Abnormal immunological findings in specimens from other organs, systems and tissues: Secondary | ICD-10-CM

## 2014-01-02 LAB — POCT URINALYSIS DIPSTICK
Blood, UA: NEGATIVE
Glucose, UA: NEGATIVE
KETONES UA: NEGATIVE
Leukocytes, UA: NEGATIVE
Nitrite, UA: NEGATIVE
PROTEIN UA: NEGATIVE

## 2014-01-02 MED ORDER — ACYCLOVIR 400 MG PO TABS: 400.0000 mg | ORAL_TABLET | Freq: Two times a day (BID) | ORAL | Status: DC

## 2014-01-02 NOTE — Progress Notes (Signed)
rx acyclovir x 30 d to [facility].

## 2014-01-02 NOTE — Progress Notes (Signed)
Pt states that she has a small rash on her bottom that looks like little pimples.

## 2014-01-16 ENCOUNTER — Ambulatory Visit (INDEPENDENT_AMBULATORY_CARE_PROVIDER_SITE_OTHER): Payer: BC Managed Care – PPO | Admitting: Women's Health

## 2014-01-16 ENCOUNTER — Encounter: Payer: Self-pay | Admitting: Women's Health

## 2014-01-16 VITALS — BP 120/60 | Wt 231.0 lb

## 2014-01-16 DIAGNOSIS — Z1389 Encounter for screening for other disorder: Secondary | ICD-10-CM

## 2014-01-16 DIAGNOSIS — Z3480 Encounter for supervision of other normal pregnancy, unspecified trimester: Secondary | ICD-10-CM

## 2014-01-16 DIAGNOSIS — Z331 Pregnant state, incidental: Secondary | ICD-10-CM

## 2014-01-16 DIAGNOSIS — O26849 Uterine size-date discrepancy, unspecified trimester: Secondary | ICD-10-CM

## 2014-01-16 LAB — POCT URINALYSIS DIPSTICK
Glucose, UA: NEGATIVE
Ketones, UA: NEGATIVE
Leukocytes, UA: NEGATIVE
NITRITE UA: NEGATIVE
Protein, UA: NEGATIVE
RBC UA: NEGATIVE

## 2014-01-16 MED ORDER — ACYCLOVIR 400 MG PO TABS: 400.0000 mg | ORAL_TABLET | Freq: Three times a day (TID) | ORAL | Status: DC

## 2014-01-16 NOTE — Patient Instructions (Signed)
Nothing to eat or drink after midnight Thursday night, come Friday AM for labs  Preterm Labor Information Preterm labor is when labor starts at less than 37 weeks of pregnancy. The normal length of a pregnancy is 39 to 41 weeks. CAUSES Often, there is no identifiable underlying cause as to why a woman goes into preterm labor. One of the most common known causes of preterm labor is infection. Infections of the uterus, cervix, vagina, amniotic sac, bladder, kidney, or even the lungs (pneumonia) can cause labor to start. Other suspected causes of preterm labor include:   Urogenital infections, such as yeast infections and bacterial vaginosis.   Uterine abnormalities (uterine shape, uterine septum, fibroids, or bleeding from the placenta).   A cervix that has been operated on (it may fail to stay closed).   Malformations in the fetus.   Multiple gestations (twins, triplets, and so on).   Breakage of the amniotic sac.  RISK FACTORS  Having a previous history of preterm labor.   Having premature rupture of membranes (PROM).   Having a placenta that covers the opening of the cervix (placenta previa).   Having a placenta that separates from the uterus (placental abruption).   Having a cervix that is too weak to hold the fetus in the uterus (incompetent cervix).   Having too much fluid in the amniotic sac (polyhydramnios).   Taking illegal drugs or smoking while pregnant.   Not gaining enough weight while pregnant.   Being younger than 39 and older than 27 years old.   Having a low socioeconomic status.   Being African American. SYMPTOMS Signs and symptoms of preterm labor include:   Menstrual-like cramps, abdominal pain, or back pain.  Uterine contractions that are regular, as frequent as six in an hour, regardless of their intensity (may be mild or painful).  Contractions that start on the top of the uterus and spread down to the lower abdomen and back.   A  sense of increased pelvic pressure.   A watery or bloody mucus discharge that comes from the vagina.  TREATMENT Depending on the length of the pregnancy and other circumstances, your health care provider may suggest bed rest. If necessary, there are medicines that can be given to stop contractions and to mature the fetal lungs. If labor happens before 34 weeks of pregnancy, a prolonged hospital stay may be recommended. Treatment depends on the condition of both you and the fetus.  WHAT SHOULD YOU DO IF YOU THINK YOU ARE IN PRETERM LABOR? Call your health care provider right away. You will need to go to the hospital to get checked immediately. HOW CAN YOU PREVENT PRETERM LABOR IN FUTURE PREGNANCIES? You should:   Stop smoking if you smoke.  Maintain healthy weight gain and avoid chemicals and drugs that are not necessary.  Be watchful for any type of infection.  Inform your health care provider if you have a known history of preterm labor. Document Released: 10/16/2003 Document Revised: 03/28/2013 Document Reviewed: 08/28/2012 [facility] Patient Information 2014 [place], [place].

## 2014-01-16 NOTE — Progress Notes (Signed)
Low-risk OB appointment G2P1001 [redacted]w[redacted]d Estimated Date of Delivery: 02/23/14 Blood pressure 120/60, weight 231 lb (104.781 kg), last menstrual period 05/19/2013.  BP, weight, and urine results all reviewed and noted.  Please refer to the obstetrical flow sheet for the fundal height and fetal heart rate documentation. Reports good fm.  Denies regular uc's, lof, vb, or uti s/s. +pressure/pelvic bone pain- recommended pregnancy belt. Still has itching daily, worse at night, bile acids 10 on 5/8, will recheck, not fasting today- can't come tomorrow am, so will do Friday AM. FH S>D, baby feels large, last baby 8lbs w/ VAVD Reviewed ptl s/s, fkc. Plan:  Continued routine obstetrical care, check bile acids/CMP Fri, growth u/s asap F/U Friday am for fasting bile acids, CMP. Needs growth/afi u/s asap for S>D, then 2wks for OB appointment

## 2014-01-20 ENCOUNTER — Encounter (HOSPITAL_COMMUNITY): Payer: Self-pay | Admitting: Family

## 2014-01-20 ENCOUNTER — Inpatient Hospital Stay (HOSPITAL_COMMUNITY)
Admission: AD | Admit: 2014-01-20 | Discharge: 2014-01-22 | DRG: 774 | Disposition: A | Discharge status: Home / Self Care | Payer: Medicaid Other | Source: Ambulatory Visit | Attending: Family Medicine | Admitting: Family Medicine

## 2014-01-20 ENCOUNTER — Encounter (HOSPITAL_COMMUNITY): Payer: Medicaid Other | Admitting: Anesthesiology

## 2014-01-20 ENCOUNTER — Inpatient Hospital Stay (HOSPITAL_COMMUNITY): Payer: Medicaid Other | Admitting: Anesthesiology

## 2014-01-20 DIAGNOSIS — O99892 Other specified diseases and conditions complicating childbirth: Secondary | ICD-10-CM | POA: Diagnosis present

## 2014-01-20 DIAGNOSIS — Z87891 Personal history of nicotine dependence: Secondary | ICD-10-CM

## 2014-01-20 DIAGNOSIS — O98519 Other viral diseases complicating pregnancy, unspecified trimester: Secondary | ICD-10-CM

## 2014-01-20 DIAGNOSIS — A6000 Herpesviral infection of urogenital system, unspecified: Secondary | ICD-10-CM

## 2014-01-20 DIAGNOSIS — O9989 Other specified diseases and conditions complicating pregnancy, childbirth and the puerperium: Secondary | ICD-10-CM

## 2014-01-20 DIAGNOSIS — O4290 Premature rupture of membranes, unspecified as to length of time between rupture and onset of labor, unspecified weeks of gestation: Secondary | ICD-10-CM

## 2014-01-20 DIAGNOSIS — Z3480 Encounter for supervision of other normal pregnancy, unspecified trimester: Secondary | ICD-10-CM

## 2014-01-20 DIAGNOSIS — Z22330 Carrier of Group B streptococcus: Secondary | ICD-10-CM

## 2014-01-20 HISTORY — DX: Unspecified infectious disease: B99.9

## 2014-01-20 LAB — CBC
HEMATOCRIT: 32.9 % — AB (ref 36.0–46.0)
Hemoglobin: 11 g/dL — ABNORMAL LOW (ref 12.0–15.0)
MCH: 25.9 pg — ABNORMAL LOW (ref 26.0–34.0)
MCHC: 33.4 g/dL (ref 30.0–36.0)
MCV: 77.6 fL — ABNORMAL LOW (ref 78.0–100.0)
Platelets: 271 10*3/uL (ref 150–400)
RBC: 4.24 MIL/uL (ref 3.87–5.11)
RDW: 14.2 % (ref 11.5–15.5)
WBC: 13.3 10*3/uL — AB (ref 4.0–10.5)

## 2014-01-20 LAB — RPR

## 2014-01-20 LAB — TYPE AND SCREEN
ABO/RH(D): A POS
ANTIBODY SCREEN: NEGATIVE

## 2014-01-20 LAB — ABO/RH: ABO/RH(D): A POS

## 2014-01-20 LAB — OB RESULTS CONSOLE GBS: GBS: POSITIVE

## 2014-01-20 LAB — POCT FERN TEST: POCT FERN TEST: POSITIVE

## 2014-01-20 LAB — GROUP B STREP BY PCR: Group B strep by PCR: POSITIVE — AB

## 2014-01-20 MED ORDER — PENICILLIN G POTASSIUM 5000000 UNITS IJ SOLR
2.5000 10*6.[IU] | INTRAMUSCULAR | Status: DC
  Filled 2014-01-20 (×4): qty 2.5

## 2014-01-20 MED ORDER — FENTANYL CITRATE 0.05 MG/ML IJ SOLN
100.0000 ug | INTRAMUSCULAR | Status: DC | PRN
  Administered 2014-01-20: 100 ug via INTRAVENOUS
  Filled 2014-01-20: qty 2

## 2014-01-20 MED ORDER — PENICILLIN G POTASSIUM 5000000 UNITS IJ SOLR
5.0000 10*6.[IU] | Freq: Once | INTRAMUSCULAR | Status: AC
  Administered 2014-01-20: 5 10*6.[IU] via INTRAVENOUS
  Filled 2014-01-20: qty 5

## 2014-01-20 MED ORDER — FERROUS SULFATE 325 (65 FE) MG PO TABS
325.0000 mg | ORAL_TABLET | Freq: Two times a day (BID) | ORAL | Status: DC
  Administered 2014-01-21 – 2014-01-22 (×3): 325 mg via ORAL
  Filled 2014-01-20 (×3): qty 1

## 2014-01-20 MED ORDER — LANOLIN HYDROUS EX OINT: 1.0000 "application " | TOPICAL_OINTMENT | CUTANEOUS | Status: DC | PRN

## 2014-01-20 MED ORDER — OXYTOCIN 40 UNITS IN LACTATED RINGERS INFUSION - SIMPLE MED
1.0000 m[IU]/min | INTRAVENOUS | Status: DC
Start: 2014-01-20 — End: 2014-01-20
  Administered 2014-01-20: 666 m[IU]/min via INTRAVENOUS
  Administered 2014-01-20: 2 m[IU]/min via INTRAVENOUS
  Filled 2014-01-20: qty 1000

## 2014-01-20 MED ORDER — DIPHENHYDRAMINE HCL 50 MG/ML IJ SOLN: 12.5000 mg | INTRAMUSCULAR | Status: DC | PRN

## 2014-01-20 MED ORDER — PRENATAL MULTIVITAMIN CH
1.0000 | ORAL_TABLET | Freq: Every day | ORAL | Status: DC
  Administered 2014-01-21 – 2014-01-22 (×2): 1 via ORAL
  Filled 2014-01-20 (×2): qty 1

## 2014-01-20 MED ORDER — FENTANYL 2.5 MCG/ML BUPIVACAINE 1/10 % EPIDURAL INFUSION (WH - ANES)
INTRAMUSCULAR | Status: DC | PRN
  Administered 2014-01-20: 14 mL/h via EPIDURAL

## 2014-01-20 MED ORDER — ACETAMINOPHEN 325 MG PO TABS: 650.0000 mg | ORAL_TABLET | ORAL | Status: DC | PRN

## 2014-01-20 MED ORDER — METHYLERGONOVINE MALEATE 0.2 MG PO TABS: 0.2000 mg | ORAL_TABLET | ORAL | Status: DC | PRN

## 2014-01-20 MED ORDER — FENTANYL 2.5 MCG/ML BUPIVACAINE 1/10 % EPIDURAL INFUSION (WH - ANES)
14.0000 mL/h | INTRAMUSCULAR | Status: DC | PRN
  Filled 2014-01-20: qty 125

## 2014-01-20 MED ORDER — LACTATED RINGERS IV SOLN
INTRAVENOUS | Status: DC
  Administered 2014-01-20 (×2): via INTRAVENOUS

## 2014-01-20 MED ORDER — ONDANSETRON HCL 4 MG/2ML IJ SOLN: 4.0000 mg | Freq: Four times a day (QID) | INTRAMUSCULAR | Status: DC | PRN

## 2014-01-20 MED ORDER — ZOLPIDEM TARTRATE 5 MG PO TABS: 5.0000 mg | ORAL_TABLET | Freq: Every evening | ORAL | Status: DC | PRN

## 2014-01-20 MED ORDER — TETANUS-DIPHTH-ACELL PERTUSSIS 5-2.5-18.5 LF-MCG/0.5 IM SUSP
0.5000 mL | Freq: Once | INTRAMUSCULAR | Status: AC
  Administered 2014-01-21: 0.5 mL via INTRAMUSCULAR

## 2014-01-20 MED ORDER — PHENYLEPHRINE 40 MCG/ML (10ML) SYRINGE FOR IV PUSH (FOR BLOOD PRESSURE SUPPORT)
80.0000 ug | PREFILLED_SYRINGE | INTRAVENOUS | Status: DC | PRN
  Filled 2014-01-20: qty 2

## 2014-01-20 MED ORDER — SENNOSIDES-DOCUSATE SODIUM 8.6-50 MG PO TABS
2.0000 | ORAL_TABLET | ORAL | Status: DC
Start: 2014-01-21 — End: 2014-01-22
  Administered 2014-01-20: 2 via ORAL
  Filled 2014-01-20: qty 2

## 2014-01-20 MED ORDER — LACTATED RINGERS IV SOLN
500.0000 mL | INTRAVENOUS | Status: DC | PRN
Start: 2014-01-20 — End: 2014-01-20

## 2014-01-20 MED ORDER — OXYCODONE-ACETAMINOPHEN 5-325 MG PO TABS: 1.0000 | ORAL_TABLET | ORAL | Status: DC | PRN

## 2014-01-20 MED ORDER — TERBUTALINE SULFATE 1 MG/ML IJ SOLN: 0.2500 mg | Freq: Once | INTRAMUSCULAR | Status: DC | PRN

## 2014-01-20 MED ORDER — LACTATED RINGERS IV SOLN: 500.0000 mL | Freq: Once | INTRAVENOUS | Status: DC

## 2014-01-20 MED ORDER — IBUPROFEN 600 MG PO TABS: 600.0000 mg | ORAL_TABLET | Freq: Four times a day (QID) | ORAL | Status: DC | PRN

## 2014-01-20 MED ORDER — WITCH HAZEL-GLYCERIN EX PADS: 1.0000 "application " | MEDICATED_PAD | CUTANEOUS | Status: DC | PRN

## 2014-01-20 MED ORDER — ONDANSETRON HCL 4 MG PO TABS: 4.0000 mg | ORAL_TABLET | ORAL | Status: DC | PRN

## 2014-01-20 MED ORDER — IBUPROFEN 600 MG PO TABS
600.0000 mg | ORAL_TABLET | Freq: Four times a day (QID) | ORAL | Status: DC
  Administered 2014-01-20 – 2014-01-22 (×8): 600 mg via ORAL
  Filled 2014-01-20 (×8): qty 1

## 2014-01-20 MED ORDER — DIBUCAINE 1 % RE OINT: 1.0000 "application " | TOPICAL_OINTMENT | RECTAL | Status: DC | PRN

## 2014-01-20 MED ORDER — DIPHENHYDRAMINE HCL 25 MG PO CAPS: 25.0000 mg | ORAL_CAPSULE | Freq: Four times a day (QID) | ORAL | Status: DC | PRN

## 2014-01-20 MED ORDER — MEASLES, MUMPS & RUBELLA VAC ~~LOC~~ INJ
0.5000 mL | INJECTION | Freq: Once | SUBCUTANEOUS | Status: DC
Start: 2014-01-21 — End: 2014-01-21
  Filled 2014-01-20: qty 0.5

## 2014-01-20 MED ORDER — EPHEDRINE 5 MG/ML INJ
10.0000 mg | INTRAVENOUS | Status: DC | PRN
  Filled 2014-01-20: qty 2
  Filled 2014-01-20: qty 4

## 2014-01-20 MED ORDER — LIDOCAINE HCL (PF) 1 % IJ SOLN
30.0000 mL | INTRAMUSCULAR | Status: DC | PRN
  Filled 2014-01-20: qty 30

## 2014-01-20 MED ORDER — SIMETHICONE 80 MG PO CHEW: 80.0000 mg | CHEWABLE_TABLET | ORAL | Status: DC | PRN

## 2014-01-20 MED ORDER — METHYLERGONOVINE MALEATE 0.2 MG/ML IJ SOLN: 0.2000 mg | INTRAMUSCULAR | Status: DC | PRN

## 2014-01-20 MED ORDER — EPHEDRINE 5 MG/ML INJ
10.0000 mg | INTRAVENOUS | Status: DC | PRN
  Filled 2014-01-20: qty 2

## 2014-01-20 MED ORDER — OXYTOCIN 40 UNITS IN LACTATED RINGERS INFUSION - SIMPLE MED: 62.5000 mL/h | INTRAVENOUS | Status: DC

## 2014-01-20 MED ORDER — CITRIC ACID-SODIUM CITRATE 334-500 MG/5ML PO SOLN: 30.0000 mL | ORAL | Status: DC | PRN

## 2014-01-20 MED ORDER — LIDOCAINE HCL (PF) 1 % IJ SOLN
INTRAMUSCULAR | Status: DC | PRN
  Administered 2014-01-20: 5 mL
  Administered 2014-01-20: 3 mL
  Administered 2014-01-20: 5 mL

## 2014-01-20 MED ORDER — FLEET ENEMA 7-19 GM/118ML RE ENEM: 1.0000 | ENEMA | Freq: Once | RECTAL | Status: DC

## 2014-01-20 MED ORDER — BENZOCAINE-MENTHOL 20-0.5 % EX AERO: 1.0000 "application " | INHALATION_SPRAY | CUTANEOUS | Status: DC | PRN

## 2014-01-20 MED ORDER — OXYTOCIN BOLUS FROM INFUSION: 500.0000 mL | INTRAVENOUS | Status: DC

## 2014-01-20 MED ORDER — ONDANSETRON HCL 4 MG/2ML IJ SOLN: 4.0000 mg | INTRAMUSCULAR | Status: DC | PRN

## 2014-01-20 MED ORDER — MAGNESIUM HYDROXIDE 400 MG/5ML PO SUSP: 30.0000 mL | ORAL | Status: DC | PRN

## 2014-01-20 MED ORDER — PHENYLEPHRINE 40 MCG/ML (10ML) SYRINGE FOR IV PUSH (FOR BLOOD PRESSURE SUPPORT)
80.0000 ug | PREFILLED_SYRINGE | INTRAVENOUS | Status: DC | PRN
  Filled 2014-01-20: qty 2
  Filled 2014-01-20: qty 10

## 2014-01-20 NOTE — Anesthesia Preprocedure Evaluation (Signed)
Anesthesia Evaluation  Patient identified by MRN, date of birth, ID band Patient awake    Reviewed: Allergy & Precautions, H&P , NPO status , Patient's Chart, lab work & pertinent test results  History of Anesthesia Complications Negative for: history of anesthetic complications  Airway Mallampati: II TM Distance: >3 FB Neck ROM: full    Dental no notable dental hx. (+) Teeth Intact   Pulmonary neg pulmonary ROS, former smoker,  breath sounds clear to auscultation  Pulmonary exam normal       Cardiovascular negative cardio ROS  Rhythm:regular Rate:Normal     Neuro/Psych negative neurological ROS  negative psych ROS   GI/Hepatic negative GI ROS, Neg liver ROS,   Endo/Other  negative endocrine ROS  Renal/GU negative Renal ROS  negative genitourinary   Musculoskeletal   Abdominal Normal abdominal exam  (+)   Peds  Hematology negative hematology ROS (+)   Anesthesia Other Findings   Reproductive/Obstetrics (+) Pregnancy                           Anesthesia Physical Anesthesia Plan  ASA: II  Anesthesia Plan: Epidural   Post-op Pain Management:    Induction:   Airway Management Planned:   Additional Equipment:   Intra-op Plan:   Post-operative Plan:   Informed Consent: I have reviewed the patients History and Physical, chart, labs and discussed the procedure including the risks, benefits and alternatives for the proposed anesthesia with the patient or authorized representative who has indicated his/her understanding and acceptance.     Plan Discussed with:   Anesthesia Plan Comments:         Anesthesia Quick Evaluation  

## 2014-01-20 NOTE — Progress Notes (Signed)
Ivonne AndrewV [name] CNM notified of pt's ROM and gestational age, states she will come and evaluate pt.

## 2014-01-20 NOTE — MAU Note (Signed)
NICU notified of pt's ROM at 35.[redacted] weeks gestation, suggests transfer due to high census.

## 2014-01-20 NOTE — H&P (Signed)
HPI: [name] is a 28 y.o. year old 372P1001 female at 674w1d weeks gestation by LMP confirmed by 6.5 week US who presents to MAU reporting Spontaneous rupture of membranes at ~2100 last night. Initially thought she was leaking urine, but became suspicious of ROM when the leaking continued. Reports mild cramping, no VB. Pos HSV serology. Denies Hx outbreak or prodrome. Valtrex for prophylaxis.   Patient Active Problem List   Diagnosis Date Noted  . Labor and delivery indication for care or intervention 01/20/2014  . HSV-2 seropositive 12/08/2013  . Supervision of other normal pregnancy 07/26/2013   [facility]  FOB [facility], [place]33yo, black, 10th baby  Pap 07/26/13: neg w/ -HRHPV  GC/CT Initial:   -/-     36+wks:  Genetic Screen NT/IT: neg  CF screen declined  Anatomic US Normal female, suboptimal COFTs- recheck at 28wks: normal at 25wks  Flu vaccine Declined 12.18  Glucose Screen  2 hr normal: 80/119/80  GBS Pending--Pos  Feed Preference breast  Contraception mirena  Circumcision Yes, if boy  Childbirth Classes declined  Pediatrician [facility]   Maternal Medical History:  Reason for admission: Rupture of membranes.     OB History   Grav Para Term Preterm Abortions TAB SAB Ect Mult Living   2 1 1       1      Past Medical History  Diagnosis Date  . Abnormal Pap smear   . Infection     HSV2   Past Surgical History  Procedure Laterality Date  . No past surgeries     Family History: family history includes Cancer in her paternal grandmother. Social History:  reports that she has quit smoking. Her smoking use included Cigarettes. She smoked 0.00 packs per day. She has never used smokeless tobacco. She reports that she does not drink alcohol or use illicit drugs.   Review of Systems  Constitutional: Negative for fever and chills.  Eyes: Negative for blurred vision and double vision.  Gastrointestinal: Positive for abdominal pain (mild cramping).  Negative for vomiting.  Genitourinary: Negative for dysuria, urgency and frequency.  Neurological: Negative for headaches.    Dilation: 3 Effacement (%): 60 Station: -3 Exam by:: [place] Estill Llerena, cnm Blood pressure 111/67, pulse 105, temperature 98.2 F (36.8 C), temperature source Oral, resp. rate 18, height 5\' 5"  (1.651 m), weight 104.781 kg (231 lb), last menstrual period 05/19/2013, SpO2 99.00%. Maternal Exam:  Uterine Assessment: Contraction strength is mild.  Contraction frequency is irregular.   Abdomen: Estimated fetal weight is 6 lb.   Fetal presentation: vertex  Introitus: Normal vulva. Vulva is negative for lesion.  Normal vagina.  Ferning test: positive.  Amniotic fluid character: clear.  Pelvis: adequate for delivery.   Cervix: Cervix evaluated by digital exam.     Fetal Exam Fetal Monitor Review: Mode: ultrasound.   Baseline rate: 130.  Variability: moderate (6-25 bpm).   Pattern: accelerations present and no decelerations.    Fetal State Assessment: Category I - tracings are normal.     Physical Exam  Constitutional: She is oriented to person, place, and time. She appears well-developed and well-nourished. No distress.  HENT:  Head: Normocephalic.  Eyes: Conjunctivae are normal.  Cardiovascular: Normal rate, regular rhythm and normal heart sounds.   Respiratory: Effort normal and breath sounds normal.  GI: Soft. There is no tenderness.  Genitourinary: Vagina normal and uterus normal. Vulva exhibits no lesion.  Musculoskeletal: Normal range of motion. She exhibits no edema and  no tenderness.  Neurological: She is alert and oriented to person, place, and time. She has normal reflexes.  Skin: Skin is warm and dry.  Psychiatric: She has a normal mood and affect.    Prenatal labs: ABO, Rh: A/POS/-- (12/18 1422) Antibody: NEG (04/29 0918) Rubella: 2.05 (12/18 1422) RPR: NON REAC (04/29 0918)  HBsAg: NEGATIVE (12/18 1422)  HIV: NONREACTIVE (04/29 [id number])   GBS: Positive (06/14 0000)  2 hr normal: 80/119/80 First trimester screen normal   Assessment: 1. Labor: Prodromal/PPROM 2. Fetal Wellbeing: Category I  3. Pain Control: None 4. GBS: Pos 5. 35.1 week IUP 6. HSV 2 seropositive-No evidence of outbreak  Plan:  1. Admit to BS per consult with MD 2. Routine L&D orders 3. Analgesia/anesthesia PRN  4. PCN 5. Pitocin 6. Discussed preterm admission w/ Dr. Mikle Boswortharlos ([place])--full. [facility] since baby is at increased risk for needing NICU care vs. [facility]. Pt prefers the latter and understands that she may be separated from baby.    Dorathy KinsmanSMITH, Quintavious Rinck 01/20/2014, 12:52 PM

## 2014-01-20 NOTE — MAU Note (Addendum)
28 yo, G2P1 at 6449w1d, presents with c/o questionable ROM clear fluid at 0200 today. Reports +FM. Denies VB, contractions. No pain.  HSV2, Acyclovir prophylaxis.

## 2014-01-20 NOTE — Anesthesia Procedure Notes (Signed)
Epidural Patient location during procedure: OB  Staffing Anesthesiologist: Phillips GroutARIGNAN, Slayde Brault Performed by: anesthesiologist   Preanesthetic Checklist Completed: patient identified, site marked, surgical consent, pre-op evaluation, timeout performed, IV checked, risks and benefits discussed and monitors and equipment checked  Epidural Patient position: sitting Prep: ChloraPrep Patient monitoring: heart rate, continuous pulse ox and blood pressure Approach: right paramedian Location: L2-L3 Injection technique: LOR saline  Needle:  Needle type: Tuohy  Needle gauge: 17 G Needle length: 9 cm and 9 Needle insertion depth: 6 cm Catheter type: closed end flexible Catheter size: 20 Guage Catheter at skin depth: 10 cm Test dose: negative  Assessment Events: blood not aspirated, injection not painful, no injection resistance, negative IV test and no paresthesia  Additional Notes   Patient tolerated the insertion well without complications.

## 2014-01-20 NOTE — H&P (Signed)
Attestation of Attending Supervision of Advanced Practitioner (PA/CNM/NP): Evaluation and management procedures were performed by the Advanced Practitioner under my supervision and collaboration.  I have reviewed the Advanced Practitioner's note and chart, and I agree with the management and plan.  Reva BoresPRATT,Vaughn Beaumier S, MD [facility] Attending 01/20/2014 1:34 PM

## 2014-01-21 NOTE — Progress Notes (Signed)
Ur chart review completed.  

## 2014-01-21 NOTE — Anesthesia Postprocedure Evaluation (Signed)
Anesthesia Post Note  Patient: [name]  Procedure(s) Performed: * No procedures listed *  Anesthesia type: Epidural  Patient location: Mother/Baby  Post pain: Pain level controlled  Post assessment: Post-op Vital signs reviewed  Last Vitals:  Filed Vitals:   01/21/14 0640  BP: 106/62  Pulse: 80  Temp: 36.7 C  Resp: 18    Post vital signs: Reviewed  Level of consciousness:alert  Complications: No apparent anesthesia complications  

## 2014-01-21 NOTE — Lactation Note (Signed)
This note was copied from the chart of Holly Clarisa Flingina Joni Peruski. Lactation Consultation Note  Patient Name: [name] ZOXWR'UToday's Date: 01/21/2014 Reason for consult: Other (Comment) (charting for exclusion)   Maternal Data Formula Feeding for Exclusion: Yes Reason for exclusion: Mother's choice to formula feed on admision  Feeding    LATCH Score/Interventions                      Lactation Tools Discussed/Used     Consult Status Consult Status: Complete    [name], [name] 01/21/2014, 4:28 PM

## 2014-01-21 NOTE — Progress Notes (Signed)
Post Partum Day 1 Subjective: no complaints, up ad lib, voiding and tolerating PO  Objective: Blood pressure 107/39, pulse 98, temperature 98.1 F (36.7 C), temperature source Oral, resp. rate 20, height 5\' 5"  (1.651 m), weight 104.781 kg (231 lb), last menstrual period 05/19/2013, SpO2 99.00%, unknown if currently breastfeeding.  Physical Exam:  General: alert, cooperative and no distress Lochia: appropriate Uterine Fundus: firm Incision: n/a DVT Evaluation: No evidence of DVT seen on physical exam.   Recent Labs  01/20/14 1125  HGB 11.0*  HCT 32.9*    Assessment/Plan: Plan for discharge tomorrow Breast feeding  Mirena    LOS: 1 day   [name], [name] 01/21/2014, 7:29 AM   I have seen and examined this patient and agree with above documentation in the resident's note.   [name], M.D. [facility] 01/21/2014 8:08 AM

## 2014-01-21 NOTE — Anesthesia Postprocedure Evaluation (Signed)
Anesthesia Post Note  Patient: [name] [name]  Procedure(s) Performed: * No procedures listed *  Anesthesia type: Epidural  Patient location: Mother/Baby  Post pain: Pain level controlled  Post assessment: Post-op Vital signs reviewed  Last Vitals:  Filed Vitals:   01/21/14 0640  BP: 106/62  Pulse: 80  Temp: 36.7 C  Resp: 18    Post vital signs: Reviewed  Level of consciousness:alert  Complications: No apparent anesthesia complications

## 2014-01-22 MED ORDER — FERROUS SULFATE 325 (65 FE) MG PO TABS: 325.0000 mg | ORAL_TABLET | Freq: Two times a day (BID) | ORAL | Status: DC

## 2014-01-22 MED ORDER — IBUPROFEN 600 MG PO TABS
600.0000 mg | ORAL_TABLET | Freq: Four times a day (QID) | ORAL | Status: DC
Start: 2014-01-22 — End: 2014-08-23

## 2014-01-22 NOTE — Discharge Instructions (Signed)

## 2014-01-22 NOTE — Discharge Summary (Signed)
Attestation of Attending Supervision of Obstetric Fellow: Evaluation and management procedures were performed by the Obstetric Fellow under my supervision and collaboration.  I have reviewed the Obstetric Fellow's note and chart, and I agree with the management and plan.  Turrell Severt, MD, FACOG Attending Obstetrician & Gynecologist Faculty Practice, [facility]   

## 2014-01-22 NOTE — Discharge Summary (Signed)
Obstetric Discharge Summary Reason for Admission: rupture of membranes Prenatal Procedures: NST Intrapartum Procedures: vacuum Postpartum Procedures: none Complications-Operative and Postpartum: none Hemoglobin  Date Value Ref Range Status  01/20/2014 11.0* 12.0 - 15.0 g/dL Final     HCT  Date Value Ref Range Status  01/20/2014 32.9* 36.0 - 46.0 % Final    Delivery Note At 4:13 PM a viable and healthy female was delivered via Vaginal, Spontaneous Delivery (Presentation: Left Occiput Anterior), compound hand.  APGAR: 8, 9; weight pending.   Placenta status: Intact, Spontaneous.  Cord: 3 vessels with the following complications: None.  Cord pH: NA  Anesthesia: Epidural  Episiotomy: None Lacerations: superficial right labial, hemostatic Suture Repair: NA Est. Blood Loss (mL): 250 ml  Mom to postpartum.  Baby to Couplet care / Skin to Skin. Placenta to: BS Feeding: Br/Bo Circ: NA Contraception: [name]  [name], [name] 01/20/2014, 4:33 PM   Physical Exam:  General: alert, cooperative and no distress Lochia: appropriate Uterine Fundus: firm Incision: na DVT Evaluation: No evidence of DVT seen on physical exam.  Discharge Diagnoses: Term Pregnancy-delivered  Discharge Information: Date: 01/22/2014 Activity: pelvic rest Diet: routine Medications: PNV and Ibuprofen Condition: stable Instructions: refer to practice specific booklet Discharge to: home Follow-up Information   [facility]. Schedule an appointment as soon as possible for a visit in 6 weeks.   Specialty:  Obstetrics and Gynecology   Contact information:   [address] [name] [id number] [phone]      Newborn Data: Live born female  Birth Weight: 5 lb 5.5 oz (2425 g) APGAR: 8, 9  Home with mother.  Pt presented with PROM and not in labor.  She was augmented and progressed to deliver a liveborn female via VAVD.  She is breast and bottle feeding and desires Mirena  for contraception.    [name], [name] 01/22/2014, 8:49 AM

## 2014-01-23 ENCOUNTER — Other Ambulatory Visit: Payer: BC Managed Care – PPO

## 2014-01-29 ENCOUNTER — Encounter: Payer: BC Managed Care – PPO | Admitting: Women's Health

## 2014-02-04 DIAGNOSIS — Z029 Encounter for administrative examinations, unspecified: Secondary | ICD-10-CM

## 2014-02-13 ENCOUNTER — Telehealth: Payer: Self-pay | Admitting: Obstetrics and Gynecology

## 2014-02-13 NOTE — Telephone Encounter (Signed)
Pt states that her short term disability company is trying to reach us before getting her disability approved.  Contact info is  Morrie Sheldonshley at [phone] ext [phone].  Patient is requesting we call them, they are telling her that there are some questions they need answered by us before approving her short term disability claim.  Her FMLA has been taken care of.

## 2014-02-14 NOTE — Telephone Encounter (Signed)
I was informed that the pt needed the paper work taken care of so that she could get her Short Term Disability, pt to go to court next Tuesday and possibly evicted from residence. I advised them that we would do everything that we could to get everything on our end done today. The pt will call the insurance tomorrow and call us back if they need anything else.

## 2014-02-14 NOTE — Telephone Encounter (Signed)
I spoke with [name] with the [employer] insurance company, they are requesting records from June 2014 to present, OV, tests, surgeries, procedures. They are sending an authorization that the pt needs to sign and fax back to them with the record.   I have attempted to contact the pt, LMOM for pt to return call.

## 2014-02-20 ENCOUNTER — Encounter: Payer: Self-pay | Admitting: Advanced Practice Midwife

## 2014-02-20 ENCOUNTER — Ambulatory Visit (INDEPENDENT_AMBULATORY_CARE_PROVIDER_SITE_OTHER): Payer: BC Managed Care – PPO | Admitting: Advanced Practice Midwife

## 2014-02-20 NOTE — Progress Notes (Signed)
  [name] is a 28 y.o. who presents for a postpartum visit. She is 4 weeks postpartum following a spontaneous vaginal delivery. I have fully reviewed the prenatal and intrapartum course. The delivery was at 35.1 gestational weeks after PPROM with IOL.  Anesthesia: epidural. Postpartum course has been uneventful. Baby's course has been uneventful. Baby is feeding by bottle. Bleeding: no bleeding. Bowel function is normal. Bladder function is normal. Patient is sexually active. Contraception method is nothing. Postpartum depression screening: negative.       Review of Systems   Constitutional: Negative for fever and chills Eyes: Negative for visual disturbances Respiratory: Negative for shortness of breath, dyspnea Cardiovascular: Negative for chest pain or palpitations  Gastrointestinal: Negative for vomiting, diarrhea and constipation Genitourinary: Negative for dysuria and urgency Musculoskeletal: Negative for back pain, joint pain, myalgias  Neurological: Negative for dizziness and headaches   Objective:    There were no vitals filed for this visit. General:  alert, cooperative and no distress   Breasts:  negative  Lungs: clear to auscultation bilaterally  Heart:  regular rate and rhythm  Abdomen: Soft, nontender   Vulva:  normal  Vagina: normal vagina  Cervix:  closed  Corpus: Well involuted     Rectal Exam: no hemorrhoids        Assessment:    normal postpartum exam.  Plan:    1. Contraception: IUD 2. Follow up in: 2 week or as needed.

## 2014-02-25 ENCOUNTER — Encounter: Payer: Self-pay | Admitting: *Deleted

## 2014-02-25 ENCOUNTER — Telehealth: Payer: Self-pay | Admitting: Advanced Practice Midwife

## 2014-02-25 NOTE — Telephone Encounter (Signed)
Pt wants to return to work as soon as possible, had vaginal delivery 06/14 and post partum visit on 07/15, is having no complications or concerns.  Letter printed and given to Joellyn HaffKim Booker to sign and pt informed I will call her when letter is ready to be picked up.

## 2014-03-13 ENCOUNTER — Encounter: Payer: Self-pay | Admitting: Advanced Practice Midwife

## 2014-03-13 ENCOUNTER — Ambulatory Visit (INDEPENDENT_AMBULATORY_CARE_PROVIDER_SITE_OTHER): Payer: BC Managed Care – PPO | Admitting: Advanced Practice Midwife

## 2014-03-13 VITALS — BP 118/72 | Ht 65.0 in | Wt 221.0 lb

## 2014-03-13 DIAGNOSIS — Z3202 Encounter for pregnancy test, result negative: Secondary | ICD-10-CM

## 2014-03-13 DIAGNOSIS — Z30430 Encounter for insertion of intrauterine contraceptive device: Secondary | ICD-10-CM | POA: Insufficient documentation

## 2014-03-13 LAB — POCT URINE PREGNANCY: PREG TEST UR: NEGATIVE

## 2014-03-13 NOTE — Progress Notes (Signed)
[name] is a 28 y.o. year old African American female Gravida 2 Para 2  who presents for placement of a Mirena IUD. Her LMP was 7/25 and her pregnancy test today is negative. She had protected intercourse 3 days ago.  The risks and benefits of the method and placement have been thouroughly reviewed with the patient and all questions were answered.  Specifically the patient is aware of failure rate of 08/998, expulsion of the IUD and of possible perforation.  The patient is aware of irregular bleeding due to the method and understands the incidence of irregular bleeding diminishes with time.  Time out was performed.  A Graves speculum was placed.  The cervix was prepped using Betadine. The uterus was found to be neutral and it sounded to 8 cm.  The cervix was grasped with a tenaculum and the IUD was inserted to 8 cm.  It was pulled back 1 cm and the IUD was disengaged.  The strings were trimmed to 3 cm.  Sonogram was performed and the proper placement of the IUD was verified.  The patient was instructed on signs and symptoms of infection and to check for the strings after each menses or each month.  The patient is to refrain from intercourse for 3 days.  The patient is scheduled for a return appointment after her first menses or 4 weeks.  CRESENZO-DISHMAN,Rolan Wrightsman 03/13/2014 10:22 AM

## 2014-04-10 ENCOUNTER — Encounter: Payer: Self-pay | Admitting: Women's Health

## 2014-04-10 ENCOUNTER — Ambulatory Visit (INDEPENDENT_AMBULATORY_CARE_PROVIDER_SITE_OTHER): Payer: BC Managed Care – PPO | Admitting: Women's Health

## 2014-04-10 VITALS — BP 104/66 | Ht 66.0 in | Wt 220.0 lb

## 2014-04-10 DIAGNOSIS — Z30431 Encounter for routine checking of intrauterine contraceptive device: Secondary | ICD-10-CM

## 2014-04-10 NOTE — Progress Notes (Signed)
Patient ID: Morrisa Aldaba, female   DOB: [birth year], 28 y.o.   MRN: [id number]   [facility] Visit  Patient name: Jamiee Milholland MRN [id number]  Date of birth: [birth year]  CC & HPI:  Ruthanne Mcneish is a 28 y.o. African American female presenting today for Mirena IUD check, was placed 03/13/14. Denies any complications, hasn't been checking strings. Bleeding was heavy at first, but has gone away.   Pertinent History Reviewed:  Medical & Surgical Hx:   Past Medical History  Diagnosis Date  . Abnormal Pap smear   . Infection     HSV2   Past Surgical History  Procedure Laterality Date  . No past surgeries     Medications: Reviewed & Updated - see associated section Social History: Reviewed -  reports that she has been smoking Cigarettes.  She has been smoking about 0.00 packs per day. She has never used smokeless tobacco.  Objective Findings:  Vitals: BP 104/66  Ht  (1.676 m)  Wt 220 lb (99.791 kg)  BMI 35.53 kg/m2  LMP 03/21/2014  Physical Examination: General appearance - alert, well appearing, and in no distress Pelvic - cx bulbous, Mirena strings visualized  No results found for this or any previous visit (from the past 24 hour(s)).   Assessment & Plan:  A:   Mirena IUD check P:  Check strings 1x/month- instructed on how to do this and importance of doing this   F/U 53yr for pap & physical, or sooner if needed   Marge Duncans CNM, [facility] 04/10/2014 9:37 AM

## 2014-06-10 ENCOUNTER — Encounter: Payer: Self-pay | Admitting: Women's Health

## 2014-06-26 ENCOUNTER — Ambulatory Visit: Payer: BC Managed Care – PPO | Admitting: Obstetrics & Gynecology

## 2014-07-03 ENCOUNTER — Ambulatory Visit: Payer: BC Managed Care – PPO | Admitting: Women's Health

## 2014-07-03 ENCOUNTER — Encounter: Payer: Self-pay | Admitting: *Deleted

## 2014-08-22 ENCOUNTER — Ambulatory Visit: Payer: Self-pay | Admitting: Advanced Practice Midwife

## 2014-08-23 ENCOUNTER — Ambulatory Visit (INDEPENDENT_AMBULATORY_CARE_PROVIDER_SITE_OTHER): Payer: BLUE CROSS/BLUE SHIELD | Admitting: Adult Health

## 2014-08-23 ENCOUNTER — Encounter: Payer: Self-pay | Admitting: Adult Health

## 2014-08-23 VITALS — BP 112/60 | Ht 66.0 in | Wt 231.4 lb

## 2014-08-23 DIAGNOSIS — N63 Unspecified lump in unspecified breast: Secondary | ICD-10-CM

## 2014-08-23 HISTORY — DX: Unspecified lump in unspecified breast: N63.0

## 2014-08-23 NOTE — Patient Instructions (Signed)
Bre[facility] 1/19 at 9 am [facility] Follow up prn Breast Cyst A breast cyst is a sac in the breast that is filled with fluid. Breast cysts are common in women. Women can have one or many cysts. When the breasts contain many cysts, it is usually due to a noncancerous (benign) condition called fibrocystic change. These lumps form under the influence of female hormones (estrogen and progesterone). The lumps are most often located in the upper, outer portion of the breast. They are often more swollen, painful, and tender before your period starts. They usually disappear after menopause, unless you are on hormone therapy.  There are several types of cysts:  Macrocyst. This is a cyst that is about 2 in. (5.1 cm) in diameter.   Microcyst. This is a tiny cyst that you cannot feel but can be seen with a mammogram or an ultrasound.   Galactocele. This is a cyst containing milk that may develop if you suddenly stop breastfeeding.   Sebaceous cyst of the skin. This type of cyst is not in the breast tissue itself. Breast cysts do not increase your risk of breast cancer. However, they must be monitored closely because they can be cancerous.  CAUSES  It is not known exactly what causes a breast cyst to form. Possible causes include:  An overgrowth of milk glands and connective tissue in the breast can block the milk glands, causing them to fill with fluid.   Scar tissue in the breast from previous surgery may block the glands, causing a cyst.  RISK FACTORS Estrogen may influence the development of a breast cyst.  SIGNS AND SYMPTOMS   Feeling a smooth, round, soft lump (like a grape) in the breast that is easily moveable.   Breast discomfort or pain.  Increase in size of the lump before your menstrual period and decrease in its size after your menstrual period.  DIAGNOSIS  A cyst can be felt during a physical exam by your health care provider. A breast X-ray exam (mammogram) and  ultrasonography will be done to confirm the diagnosis. Fluid may be removed from the cyst with a needle (fine needle aspiration) to make sure the cyst is not cancerous.  TREATMENT  Treatment may not be necessary. Your health care provider may monitor the cyst to see if it goes away on its own. If treatment is needed, it may include:  Hormone treatment.   Needle aspiration. There is a chance of the cyst coming back after aspiration.   Surgery to remove the whole cyst.  HOME CARE INSTRUCTIONS   Keep all follow-up appointments with your health care provider.  See your health care provider regularly:  Get a yearly exam by your health care provider.  Have a clinical breast exam by a health care provider every 1-3 years if you are 6820-28 years of age. After age 28 years, you should have the exam every year.   Get mammogram tests as directed by your health care provider.   Understand the normal appearance and feel of your breasts and perform breast self-exams.   Only take over-the-counter or prescription medicines as directed by your health care provider.   Wear a supportive bra, especially when exercising.   Avoid caffeine.   Reduce your salt intake, especially before your menstrual period. Too much salt can cause fluid retention, breast swelling, and discomfort.  SEEK MEDICAL CARE IF:   You feel, or think you feel, a lump in your breast.   You notice  that both breasts look or feel different than usual.   Your breast is still causing pain after your menstrual period is over.   You need medicine for breast pain and swelling that occurs with your menstrual period.  SEEK IMMEDIATE MEDICAL CARE IF:   You have severe pain, tenderness, redness, or warmth in your breast.   You have nipple discharge or bleeding.   Your breast lump becomes hard and painful.   You find new lumps or bumps that were not there before.   You feel lumps in your armpit (axilla).   You  notice dimpling or wrinkling of the breast or nipple.   You have a fever.  MAKE SURE YOU:  Understand these instructions.  Will watch your condition.  Will get help right away if you are not doing well or get worse. Document Released: 07/26/2005 Document Revised: 03/28/2013 Document Reviewed: 02/22/2013 [place] [facility] Patient Information 2015 [place], [place]. This information is not intended to replace advice given to you by your health care provider. Make sure you discuss any questions you have with your health care provider.

## 2014-08-23 NOTE — Progress Notes (Signed)
Subjective:     Patient ID: Holly FlingNina Joni Edwards, female   DOB: 1986-01-01, 29 y.o.   MRN: [id number]  HPI [name] is a 29 year old black female in complaining of area left breast that comes and goes, it will be red and itches like whelp, has picture on phone that shows red puffy area about size of 50 cent piece.She has IUD.  Review of Systems See HPI Reviewed past medical,surgical, social and family history. Reviewed medications and allergies.     Objective:   Physical Exam BP 112/60 mmHg  Ht 5\' 6"  (1.676 m)  Wt 231 lb 6.4 oz (104.962 kg)  BMI 37.37 kg/m2  LMP 08/19/2014  Breastfeeding? No    Skin warm and dry,  Breasts:no dominate palpable mass, retraction or nipple discharge on right, on left no retraction or nipple discharge has pea sized nodule at 3 o'clock 2 fb from areola at site of where its red and itches and has slightly discolored area there, too. She has some bilateral regular irregularities in UOQ.  Assessment:     Breast nodule    Plan:    Call when "whelp" there and try to be seen Left breast US 1/19 at 9 am [facility] Follow up prn Review handout on breast cyst

## 2014-08-27 ENCOUNTER — Ambulatory Visit (HOSPITAL_COMMUNITY)
Admission: RE | Admit: 2014-08-27 | Discharge: 2014-08-27 | Disposition: A | Discharge status: Home / Self Care | Payer: BLUE CROSS/BLUE SHIELD | Source: Ambulatory Visit | Attending: Adult Health | Admitting: Adult Health

## 2014-08-27 DIAGNOSIS — N63 Unspecified lump in unspecified breast: Secondary | ICD-10-CM

## 2014-12-29 ENCOUNTER — Emergency Department (HOSPITAL_COMMUNITY)
Admission: EM | Admit: 2014-12-29 | Discharge: 2014-12-29 | Disposition: A | Discharge status: Home / Self Care | Payer: BLUE CROSS/BLUE SHIELD | Attending: Emergency Medicine | Admitting: Emergency Medicine

## 2014-12-29 ENCOUNTER — Emergency Department (HOSPITAL_COMMUNITY): Payer: BLUE CROSS/BLUE SHIELD

## 2014-12-29 ENCOUNTER — Encounter (HOSPITAL_COMMUNITY): Payer: Self-pay

## 2014-12-29 DIAGNOSIS — Z720 Tobacco use: Secondary | ICD-10-CM | POA: Diagnosis not present

## 2014-12-29 DIAGNOSIS — Z8619 Personal history of other infectious and parasitic diseases: Secondary | ICD-10-CM | POA: Insufficient documentation

## 2014-12-29 DIAGNOSIS — E669 Obesity, unspecified: Secondary | ICD-10-CM | POA: Insufficient documentation

## 2014-12-29 DIAGNOSIS — Z8742 Personal history of other diseases of the female genital tract: Secondary | ICD-10-CM | POA: Insufficient documentation

## 2014-12-29 DIAGNOSIS — M25562 Pain in left knee: Secondary | ICD-10-CM | POA: Diagnosis not present

## 2014-12-29 MED ORDER — NAPROXEN 500 MG PO TABS: 500.0000 mg | ORAL_TABLET | Freq: Two times a day (BID) | ORAL | Status: DC

## 2014-12-29 MED ORDER — HYDROCODONE-ACETAMINOPHEN 5-325 MG PO TABS: 1.0000 | ORAL_TABLET | ORAL | Status: DC | PRN

## 2014-12-29 NOTE — ED Notes (Signed)
Patient reports that her left knee and back of leg has been hurting for 2 weeks. Progressively getting worse. Feels like pulled muscle. Hurts with laying down. Has noticed swelling in knee. No know injury

## 2014-12-29 NOTE — ED Provider Notes (Signed)
CSN: [id number]     Arrival date & time 12/29/14  0719 History   First MD Initiated Contact with Patient 12/29/14 0801     Chief Complaint  Patient presents with  . Leg Pain     (Consider location/radiation/quality/duration/timing/severity/associated sxs/prior Treatment) Patient is a 29 y.o. female presenting with leg pain. The history is provided by the patient.  Leg Pain Location:  Knee Time since incident:  2 weeks Injury: no   Knee location:  L knee Pain details:    Quality:  Throbbing and shooting   Radiates to:  Does not radiate   Severity:  Moderate   Onset quality:  Sudden   Duration:  2 weeks   Timing:  Constant   Progression:  Worsening Chronicity:  New Dislocation: no   Foreign body present:  No foreign bodies Prior injury to area:  No Worsened by:  Activity and bearing weight Associated symptoms: swelling    [name] is a 29 y.o. female who presents to the ED with left knee pain that started 2 weeks ago. She reports having had similar pain in the right knee a couple months ago but it went away. She describes the pain in the left knee as throbbing, shooting pain that is worse after standing for a long time. She reports working nights at [employer] and is on her feet all night. The pain is worse after 12 hour shifts than 8 hours.  She has taken tylenol and Advil for pain with mild relief.    Patient states that her right knee is beginning to hurt again also but is just a dull ache so far.   Past Medical History  Diagnosis Date  . Abnormal Pap smear   . Infection     HSV2  . Vaginal Pap smear, abnormal   . Breast nodule 08/23/2014   Past Surgical History  Procedure Laterality Date  . No past surgeries     Family History  Problem Relation Age of Onset  . Cancer Paternal Grandmother     breast  . Hypertension Mother   . Heart disease Mother   . Hypertension Sister   . Diabetes Maternal Grandmother   . Kidney disease Maternal Grandmother     kidney  failure  . Cancer Paternal Grandfather     lung  . Cancer Paternal Aunt     breast   History  Substance Use Topics  . Smoking status: Current Some Day Smoker -- 0.50 packs/day for 2 years    Types: Cigarettes  . Smokeless tobacco: Never Used  . Alcohol Use: No     Comment: Occasional   OB History    Gravida Para Term Preterm AB TAB SAB Ectopic Multiple Living   2 2 1 1      2      Review of Systems Negative except as stated in HPI   Allergies  Review of patient's allergies indicates no known allergies.  Home Medications   Prior to Admission medications   Medication Sig Start Date End Date Taking? Authorizing Provider  HYDROcodone-acetaminophen (NORCO/VICODIN) 5-325 MG per tablet Take 1 tablet by mouth every 4 (four) hours as needed. 12/29/14   Ashkan Chamberland Orlene OchM Tiye Huwe, NP  levonorgestrel (MIRENA) 20 MCG/24HR IUD 1 each by Intrauterine route once.    Historical Provider, MD  naproxen (NAPROSYN) 500 MG tablet Take 1 tablet (500 mg total) by mouth 2 (two) times daily. 12/29/14   Josephmichael Lisenbee Orlene OchM Kaileigh Viswanathan, NP   BP 143/81 mmHg  Pulse 98  Temp(Src) 97.8 F (36.6 C) (Oral)  Resp 18  Ht  (1.676 m)  Wt 210 lb (95.255 kg)  BMI 33.91 kg/m2  SpO2 100% Physical Exam  Constitutional: She is oriented to person, place, and time. No distress.  obese  HENT:  Head: Normocephalic.  Eyes: EOM are normal.  Neck: Neck supple.  Cardiovascular: Normal rate.   Pulmonary/Chest: Effort normal.  Abdominal: Soft. There is no tenderness.  Musculoskeletal: Normal range of motion.       Left knee: She exhibits swelling. She exhibits normal range of motion, no ecchymosis, no deformity, no laceration, no erythema and normal alignment. Tenderness found.  There is swelling noted to the anterior knee, there is mild pain with palpation and range of motion. The pain is over the LCL. When standing there is no swelling to the posterior aspect of the left knee and I the patient is not tender on palpation.  Pedal pulses 2+  bilateral, adequate circulation, good touch sensation.   Neurological: She is alert and oriented to person, place, and time. No cranial nerve deficit.  Skin: Skin is warm and dry.  Psychiatric: She has a normal mood and affect. Her behavior is normal.  Nursing note and vitals reviewed.   ED Course  Procedures (including critical care time) Labs Review Labs Reviewed - No data to display  Imaging Review Dg Knee 2 Views Left  12/29/2014   CLINICAL DATA:  Left knee pain for the past 2 weeks anteriorly and posteriorly with some swelling. No reported injury.  EXAM: LEFT KNEE - 1-2 VIEW  COMPARISON:  None.  FINDINGS: Mild medial joint space narrowing. No significant spur formation. No effusion.  IMPRESSION: Mild medial joint space narrowing.   Electronically Signed   By: Beckie Salts M.D.   On: 12/29/2014 08:26     MDM  30 y.o. female with left knee pain that started 2 weeks ago. Stable for d/c without neurovascular compromise. Placed in knee immobilizer, ice, rest, follow up with ortho, pain management.  Discussed with the patient clinical and x-ray findings and all questioned fully answered. She will return if any problems arise.  Final diagnoses:  Left knee pain      Wallace, NP 12/29/14 0454  Vanetta Mulders, MD 12/31/14 [phone]

## 2014-12-29 NOTE — Discharge Instructions (Signed)
Your x-ray today shows no fracture or dislocation. You will need to follow up with the bone doctor for further evaluation. Do not take the narcotic if driving or while at work because it will make you sleepy.

## 2015-02-11 ENCOUNTER — Encounter (HOSPITAL_COMMUNITY): Payer: Self-pay | Admitting: Emergency Medicine

## 2015-02-11 ENCOUNTER — Emergency Department (HOSPITAL_COMMUNITY)
Admission: EM | Admit: 2015-02-11 | Discharge: 2015-02-11 | Disposition: A | Discharge status: Home / Self Care | Payer: BLUE CROSS/BLUE SHIELD | Attending: Emergency Medicine | Admitting: Emergency Medicine

## 2015-02-11 DIAGNOSIS — Z8742 Personal history of other diseases of the female genital tract: Secondary | ICD-10-CM | POA: Diagnosis not present

## 2015-02-11 DIAGNOSIS — Z8619 Personal history of other infectious and parasitic diseases: Secondary | ICD-10-CM | POA: Diagnosis not present

## 2015-02-11 DIAGNOSIS — Z79899 Other long term (current) drug therapy: Secondary | ICD-10-CM | POA: Diagnosis not present

## 2015-02-11 DIAGNOSIS — J029 Acute pharyngitis, unspecified: Secondary | ICD-10-CM | POA: Diagnosis present

## 2015-02-11 DIAGNOSIS — Z720 Tobacco use: Secondary | ICD-10-CM | POA: Insufficient documentation

## 2015-02-11 DIAGNOSIS — J020 Streptococcal pharyngitis: Secondary | ICD-10-CM | POA: Diagnosis not present

## 2015-02-11 LAB — RAPID STREP SCREEN (MED CTR MEBANE ONLY): Streptococcus, Group A Screen (Direct): POSITIVE — AB

## 2015-02-11 MED ORDER — PENICILLIN V POTASSIUM 250 MG PO TABS
ORAL_TABLET | ORAL | Status: AC
  Filled 2015-02-11: qty 2

## 2015-02-11 MED ORDER — PENICILLIN V POTASSIUM 250 MG PO TABS
500.0000 mg | ORAL_TABLET | Freq: Once | ORAL | Status: AC
  Administered 2015-02-11: 500 mg via ORAL

## 2015-02-11 MED ORDER — PENICILLIN V POTASSIUM 500 MG PO TABS: 500.0000 mg | ORAL_TABLET | Freq: Four times a day (QID) | ORAL | Status: AC

## 2015-02-11 NOTE — Discharge Instructions (Signed)
Follow up with your md if not improving.  Drink plenty of fluids.   Tylenol or motrin for fever

## 2015-02-11 NOTE — ED Notes (Addendum)
PT c/o sore throat, cough, body aches and fever starting this am. PT reports taking Dayquil around 1500 today.

## 2015-02-11 NOTE — ED Notes (Signed)
Patient with no complaints at this time. Respirations even and unlabored. Skin warm/dry. Discharge instructions reviewed with patient at this time. Patient given opportunity to voice concerns/ask questions. Patient discharged at this time and left Emergency Department with steady gait.   

## 2015-02-11 NOTE — ED Provider Notes (Signed)
CSN: [id number]     Arrival date & time 02/11/15  1732 History   First MD Initiated Contact with Patient 02/11/15 1748     Chief Complaint  Patient presents with  . Sore Throat     (Consider location/radiation/quality/duration/timing/severity/associated sxs/prior Treatment) Patient is a 29 y.o. female presenting with pharyngitis. The history is provided by the patient (pt complains of a sorethroat).  Sore Throat This is a new problem. The current episode started 12 to 24 hours ago. The problem occurs constantly. The problem has not changed since onset.Pertinent negatives include no chest pain, no abdominal pain and no headaches. Nothing aggravates the symptoms. Nothing relieves the symptoms.    Past Medical History  Diagnosis Date  . Abnormal Pap smear   . Infection     HSV2  . Vaginal Pap smear, abnormal   . Breast nodule 08/23/2014   Past Surgical History  Procedure Laterality Date  . No past surgeries     Family History  Problem Relation Age of Onset  . Cancer Paternal Grandmother     breast  . Hypertension Mother   . Heart disease Mother   . Hypertension Sister   . Diabetes Maternal Grandmother   . Kidney disease Maternal Grandmother     kidney failure  . Cancer Paternal Grandfather     lung  . Cancer Paternal Aunt     breast   History  Substance Use Topics  . Smoking status: Current Some Day Smoker -- 0.50 packs/day for 2 years    Types: Cigarettes  . Smokeless tobacco: Never Used  . Alcohol Use: No     Comment: Occasional   OB History    Gravida Para Term Preterm AB TAB SAB Ectopic Multiple Living   2 2 1 1      2      Review of Systems  Constitutional: Negative for appetite change and fatigue.  HENT: Negative for congestion, ear discharge and sinus pressure.        Sore throat  Eyes: Negative for discharge.  Respiratory: Negative for cough.   Cardiovascular: Negative for chest pain.  Gastrointestinal: Negative for abdominal pain and diarrhea.   Genitourinary: Negative for frequency and hematuria.  Musculoskeletal: Negative for back pain.  Skin: Negative for rash.  Neurological: Negative for seizures and headaches.  Psychiatric/Behavioral: Negative for hallucinations.      Allergies  Review of patient's allergies indicates no known allergies.  Home Medications   Prior to Admission medications   Medication Sig Start Date End Date Taking? Authorizing Provider  Pseudoephedrine-APAP-DM (DAYQUIL MULTI-SYMPTOM COLD/FLU PO) Take 15-30 mLs by mouth daily as needed (for sore throat/pain).   Yes Historical Provider, MD  HYDROcodone-acetaminophen (NORCO/VICODIN) 5-325 MG per tablet Take 1 tablet by mouth every 4 (four) hours as needed. Patient not taking: Reported on 02/11/2015 12/29/14   Janne NapoleonHope M Neese, NP  levonorgestrel (MIRENA) 20 MCG/24HR IUD 1 each by Intrauterine route once.    Historical Provider, MD  naproxen (NAPROSYN) 500 MG tablet Take 1 tablet (500 mg total) by mouth 2 (two) times daily. Patient not taking: Reported on 02/11/2015 12/29/14   Janne NapoleonHope M Neese, NP  penicillin v potassium (VEETID) 500 MG tablet Take 1 tablet (500 mg total) by mouth 4 (four) times daily. 02/11/15 02/18/15  Bethann BerkshireJoseph Shina Wass, MD   BP 129/70 mmHg  Pulse 117  Temp(Src) 99.8 F (37.7 C) (Oral)  Resp 16  Ht 5\' 6"  (1.676 m)  Wt 230 lb (104.327 kg)  BMI 37.14 kg/m2  SpO2 100%  LMP 01/18/2015 Physical Exam  Constitutional: She is oriented to person, place, and time. She appears well-developed.  HENT:  Head: Normocephalic.  pharnx mildly inflamed  Eyes: Conjunctivae and EOM are normal. No scleral icterus.  Neck: Neck supple. No thyromegaly present.  Cardiovascular: Normal rate and regular rhythm.  Exam reveals no gallop and no friction rub.   No murmur heard. Pulmonary/Chest: No stridor. She has no wheezes. She has no rales. She exhibits no tenderness.  Abdominal: She exhibits no distension. There is no tenderness. There is no rebound.  Musculoskeletal:  Normal range of motion. She exhibits no edema.  Lymphadenopathy:    She has no cervical adenopathy.  Neurological: She is oriented to person, place, and time. She exhibits normal muscle tone. Coordination normal.  Skin: No rash noted. No erythema.  Psychiatric: She has a normal mood and affect. Her behavior is normal.    ED Course  Procedures (including critical care time) Labs Review Labs Reviewed  RAPID STREP SCREEN (NOT AT [facility]) - Abnormal; Notable for the following:    Streptococcus, Group A Screen (Direct) POSITIVE (*)    All other components within normal limits    Imaging Review No results found.   EKG Interpretation None      MDM   Final diagnoses:  Strep throat    Strep throat,  rx penicillin    Bethann Berkshire, MD 02/11/15 [phone]

## 2015-10-14 ENCOUNTER — Encounter (HOSPITAL_COMMUNITY): Payer: Self-pay | Admitting: Emergency Medicine

## 2015-10-14 ENCOUNTER — Emergency Department (HOSPITAL_COMMUNITY)
Admission: EM | Admit: 2015-10-14 | Discharge: 2015-10-14 | Disposition: A | Discharge status: Home / Self Care | Payer: BLUE CROSS/BLUE SHIELD | Attending: Emergency Medicine | Admitting: Emergency Medicine

## 2015-10-14 DIAGNOSIS — R0981 Nasal congestion: Secondary | ICD-10-CM | POA: Diagnosis present

## 2015-10-14 DIAGNOSIS — J069 Acute upper respiratory infection, unspecified: Secondary | ICD-10-CM

## 2015-10-14 DIAGNOSIS — F17210 Nicotine dependence, cigarettes, uncomplicated: Secondary | ICD-10-CM | POA: Insufficient documentation

## 2015-10-14 MED ORDER — FEXOFENADINE-PSEUDOEPHED ER 60-120 MG PO TB12: 1.0000 | ORAL_TABLET | Freq: Two times a day (BID) | ORAL | Status: DC | PRN

## 2015-10-14 NOTE — Discharge Instructions (Signed)
Upper Respiratory Infection, Adult Most upper respiratory infections (URIs) are a viral infection of the air passages leading to the lungs. A URI affects the nose, throat, and upper air passages. The most common type of URI is nasopharyngitis and is typically referred to as "the common cold." URIs run their course and usually go away on their own. Most of the time, a URI does not require medical attention, but sometimes a bacterial infection in the upper airways can follow a viral infection. This is called a secondary infection. Sinus and middle ear infections are common types of secondary upper respiratory infections. Bacterial pneumonia can also complicate a URI. A URI can worsen asthma and chronic obstructive pulmonary disease (COPD). Sometimes, these complications can require emergency medical care and may be life threatening.  CAUSES Almost all URIs are caused by viruses. A virus is a type of germ and can spread from one person to another.  RISKS FACTORS You may be at risk for a URI if:   You smoke.   You have chronic heart or lung disease.  You have a weakened defense (immune) system.   You are very young or very old.   You have nasal allergies or asthma.  You work in crowded or poorly ventilated areas.  You work in health care facilities or schools. SIGNS AND SYMPTOMS  Symptoms typically develop 2-3 days after you come in contact with a cold virus. Most viral URIs last 7-10 days. However, viral URIs from the influenza virus (flu virus) can last 14-18 days and are typically more severe. Symptoms may include:   Runny or stuffy (congested) nose.   Sneezing.   Cough.   Sore throat.   Headache.   Fatigue.   Fever.   Loss of appetite.   Pain in your forehead, behind your eyes, and over your cheekbones (sinus pain).  Muscle aches.  DIAGNOSIS  Your health care provider may diagnose a URI by:  Physical exam.  Tests to check that your symptoms are not due to  another condition such as:  Strep throat.  Sinusitis.  Pneumonia.  Asthma. TREATMENT  A URI goes away on its own with time. It cannot be cured with medicines, but medicines may be prescribed or recommended to relieve symptoms. Medicines may help:  Reduce your fever.  Reduce your cough.  Relieve nasal congestion. HOME CARE INSTRUCTIONS   Take medicines only as directed by your health care provider.   Gargle warm saltwater or take cough drops to comfort your throat as directed by your health care provider.  Use a warm mist humidifier or inhale steam from a shower to increase air moisture. This may make it easier to breathe.  Drink enough fluid to keep your urine clear or pale yellow.   Eat soups and other clear broths and maintain good nutrition.   Rest as needed.   Return to work when your temperature has returned to normal or as your health care provider advises. You may need to stay home longer to avoid infecting others. You can also use a face mask and careful hand washing to prevent spread of the virus.  Increase the usage of your inhaler if you have asthma.   Do not use any tobacco products, including cigarettes, chewing tobacco, or electronic cigarettes. If you need help quitting, ask your health care provider. PREVENTION  The best way to protect yourself from getting a cold is to practice good hygiene.   Avoid oral or hand contact with people with cold   symptoms.   Wash your hands often if contact occurs.  There is no clear evidence that vitamin C, vitamin E, echinacea, or exercise reduces the chance of developing a cold. However, it is always recommended to get plenty of rest, exercise, and practice good nutrition.  SEEK MEDICAL CARE IF:   You are getting worse rather than better.   Your symptoms are not controlled by medicine.   You have chills.  You have worsening shortness of breath.  You have brown or red mucus.  You have yellow or brown nasal  discharge.  You have pain in your face, especially when you bend forward.  You have a fever.  You have swollen neck glands.  You have pain while swallowing.  You have white areas in the back of your throat. SEEK IMMEDIATE MEDICAL CARE IF:   You have severe or persistent:  Headache.  Ear pain.  Sinus pain.  Chest pain.  You have chronic lung disease and any of the following:  Wheezing.  Prolonged cough.  Coughing up blood.  A change in your usual mucus.  You have a stiff neck.  You have changes in your:  Vision.  Hearing.  Thinking.  Mood. MAKE SURE YOU:   Understand these instructions.  Will watch your condition.  Will get help right away if you are not doing well or get worse.   This information is not intended to replace advice given to you by your health care provider. Make sure you discuss any questions you have with your health care provider.   Document Released: 01/19/2001 Document Revised: 12/10/2014 Document Reviewed: 10/31/2013 Elsevier Interactive Patient Education 2016 [employer]  

## 2015-10-14 NOTE — ED Notes (Signed)
Pt states she has a lot of pressure in her face with nasal congestion and sore throat in the mornings.  Started 2 weeks ago.

## 2015-10-14 NOTE — ED Provider Notes (Signed)
CSN: [id number]     Arrival date & time 10/14/15  4098 History   First MD Initiated Contact with Patient 10/14/15 [phone]     Chief Complaint  Patient presents with  . Nasal Congestion     (Consider location/radiation/quality/duration/timing/severity/associated sxs/prior Treatment) HPI   30 year old female with facial pressure, cough and sore throat. Symptom onset was about 2 weeks ago. Worsening within the last 2-3 days. Constant pressure around her nose and under her eyes. Worse with leaning forward. Clear rhinorrhea. No fevers. Mild sore throat. No specific respiratory complaints. No dizziness or lightheadedness. No neck pain or stiffness. Denies any fever. Has not tried taking anything for her symptoms.  Past Medical History  Diagnosis Date  . Abnormal Pap smear   . Infection     HSV2  . Vaginal Pap smear, abnormal   . Breast nodule 08/23/2014   Past Surgical History  Procedure Laterality Date  . No past surgeries     Family History  Problem Relation Age of Onset  . Cancer Paternal Grandmother     breast  . Hypertension Mother   . Heart disease Mother   . Hypertension Sister   . Diabetes Maternal Grandmother   . Kidney disease Maternal Grandmother     kidney failure  . Cancer Paternal Grandfather     lung  . Cancer Paternal Aunt     breast   Social History  Substance Use Topics  . Smoking status: Current Some Day Smoker -- 0.50 packs/day for 2 years    Types: Cigarettes  . Smokeless tobacco: Never Used  . Alcohol Use: No     Comment: Occasional   OB History    Gravida Para Term Preterm AB TAB SAB Ectopic Multiple Living   Review of Systems  All systems reviewed and negative, other than as noted in HPI.   Allergies  Review of patient's allergies indicates no known allergies.  Home Medications   Prior to Admission medications   Medication Sig Start Date End Date Taking? Authorizing Provider  HYDROcodone-acetaminophen (NORCO/VICODIN)  5-325 MG per tablet Take 1 tablet by mouth every 4 (four) hours as needed. Patient not taking: Reported on 02/11/2015 12/29/14   Janne Napoleon, NP  levonorgestrel (MIRENA) 20 MCG/24HR IUD 1 each by Intrauterine route once.    Historical Provider, MD  naproxen (NAPROSYN) 500 MG tablet Take 1 tablet (500 mg total) by mouth 2 (two) times daily. Patient not taking: Reported on 02/11/2015 12/29/14   Janne Napoleon, NP  Pseudoephedrine-APAP-DM (DAYQUIL MULTI-SYMPTOM COLD/FLU PO) Take 15-30 mLs by mouth daily as needed (for sore throat/pain).    Historical Provider, MD   BP 122/72 mmHg  Pulse 115  Temp(Src) 98.4 F (36.9 C) (Oral)  Resp 18  Ht  (1.676 m)  Wt 230 lb (104.327 kg)  BMI 37.14 kg/m2  SpO2 100%  LMP 09/20/2015 Physical Exam  Constitutional: She appears well-developed and well-nourished. No distress.  HENT:  Head: Normocephalic and atraumatic.  Mouth/Throat: Oropharynx is clear and moist.  Mild tenderness over the maxillary sinuses. Boggy nasal mucosa. Clear rhinorrhea.her posterior pharynx is clear. Uvula is midline. Handling secretions. Normal sounding voice.  Eyes: Conjunctivae and EOM are normal. Pupils are equal, round, and reactive to light. Right eye exhibits no discharge. Left eye exhibits no discharge.  Neck: Neck supple.  Cardiovascular: Regular rhythm and normal heart sounds.  Exam reveals no gallop and no friction  rub.   No murmur heard. Mildly tachycardic  Pulmonary/Chest: Effort normal and breath sounds normal. No respiratory distress.  Abdominal: Soft. She exhibits no distension. There is no tenderness.  Musculoskeletal: She exhibits no edema or tenderness.  Neurological: She is alert.  Skin: Skin is warm and dry.  Psychiatric: She has a normal mood and affect. Her behavior is normal. Thought content normal.  Nursing note and vitals reviewed.   ED Course  Procedures (including critical care time) Labs Review Labs Reviewed - No data to display  Imaging  Review No results found. I have personally reviewed and evaluated these images and lab results as part of my medical decision-making.   EKG Interpretation None      MDM   Final diagnoses:  URI (upper respiratory infection)    30 year old female with URI symptoms/congestion. She is afebrile. Mildly tachycardic but otherwise exam is fairly reassuring. She does have some mild tenderness over maxillary sinuses. Boggy nasal mucosa. Her lungs are clear. She has no increased work of breathing. Oxygen saturations are normal on room air. Plan as needed decongestants. Return precautions were discussed.    Raeford RazorStephen Neville Walston, MD 10/17/15 [phone]

## 2015-10-14 NOTE — ED Notes (Signed)
Pt c/o sinus pain and pressure x 2 weeks.

## 2016-03-15 ENCOUNTER — Emergency Department (HOSPITAL_COMMUNITY)
Admission: EM | Admit: 2016-03-15 | Discharge: 2016-03-15 | Disposition: A | Discharge status: Home / Self Care | Payer: BLUE CROSS/BLUE SHIELD | Attending: Emergency Medicine | Admitting: Emergency Medicine

## 2016-03-15 ENCOUNTER — Encounter (HOSPITAL_COMMUNITY): Payer: Self-pay | Admitting: Emergency Medicine

## 2016-03-15 ENCOUNTER — Emergency Department (HOSPITAL_COMMUNITY): Payer: BLUE CROSS/BLUE SHIELD

## 2016-03-15 DIAGNOSIS — M25561 Pain in right knee: Secondary | ICD-10-CM | POA: Diagnosis not present

## 2016-03-15 DIAGNOSIS — Z79899 Other long term (current) drug therapy: Secondary | ICD-10-CM | POA: Insufficient documentation

## 2016-03-15 DIAGNOSIS — F17210 Nicotine dependence, cigarettes, uncomplicated: Secondary | ICD-10-CM | POA: Diagnosis not present

## 2016-03-15 DIAGNOSIS — M7989 Other specified soft tissue disorders: Secondary | ICD-10-CM | POA: Diagnosis not present

## 2016-03-15 DIAGNOSIS — M25562 Pain in left knee: Secondary | ICD-10-CM | POA: Diagnosis not present

## 2016-03-15 MED ORDER — IBUPROFEN 800 MG PO TABS
800.0000 mg | ORAL_TABLET | Freq: Once | ORAL | Status: AC
  Administered 2016-03-15: 800 mg via ORAL
  Filled 2016-03-15: qty 1

## 2016-03-15 MED ORDER — DICLOFENAC SODIUM 75 MG PO TBEC: 75.0000 mg | DELAYED_RELEASE_TABLET | Freq: Two times a day (BID) | ORAL | 0 refills | Status: DC

## 2016-03-15 NOTE — Discharge Instructions (Signed)
Wear the ace wrap as needed with weight bearing.  TAke it off at rest and bedtime.  Apply ice packs on/off to your knee.  Call the orthopedic doctor listed to arrange a follow-up appt if not improving

## 2016-03-15 NOTE — ED Provider Notes (Signed)
[facility]-EMERGENCY DEPT Provider Note   CSN: [id number] Arrival date & time: 03/15/16  0807  First Provider Contact:  First MD Initiated Contact with Patient 03/15/16 [phone]        History   Chief Complaint Chief Complaint  Patient presents with  . Knee Pain    HPI [name] is a 30 y.o. female.  HPI   [name] is a 30 y.o. female who presents to the Emergency Department complaining of Right knee pain for one week. She states that she walks and stands throughout most of her work schedule. She reports swelling of the knee for several days. Swelling has been intermittent. She reports pain and "crunching" of her knee with walking up and down steps. She's been taking over-the-counter ibuprofen without relief. She denies fever, chills, history of knee pain or injury, or redness of the joint.   Past Medical History:  Diagnosis Date  . Abnormal Pap smear   . Breast nodule 08/23/2014  . Infection    HSV2  . Vaginal Pap smear, abnormal     Patient Active Problem List   Diagnosis Date Noted  . Breast nodule 08/23/2014  . Encounter for IUD insertion 03/13/2014  . Preterm delivery 01/20/2014  . HSV-2 seropositive 12/08/2013    Past Surgical History:  Procedure Laterality Date  . NO PAST SURGERIES      OB History    Gravida Para Term Preterm AB Living   2 2 1 1   2    SAB TAB Ectopic Multiple Live Births           2       Home Medications    Prior to Admission medications   Medication Sig Start Date End Date Taking? Authorizing Provider  fexofenadine-pseudoephedrine (ALLEGRA-D) 60-120 MG 12 hr tablet Take 1 tablet by mouth 2 (two) times daily as needed. 10/14/15   Raeford RazorStephen Kohut, MD  HYDROcodone-acetaminophen (NORCO/VICODIN) 5-325 MG per tablet Take 1 tablet by mouth every 4 (four) hours as needed. Patient not taking: Reported on 02/11/2015 12/29/14   Janne NapoleonHope M Neese, NP  levonorgestrel (MIRENA) 20 MCG/24HR IUD 1 each by Intrauterine route once.    Historical Provider, MD    naproxen (NAPROSYN) 500 MG tablet Take 1 tablet (500 mg total) by mouth 2 (two) times daily. Patient not taking: Reported on 02/11/2015 12/29/14   Janne NapoleonHope M Neese, NP  Pseudoephedrine-APAP-DM (DAYQUIL MULTI-SYMPTOM COLD/FLU PO) Take 15-30 mLs by mouth daily as needed (for sore throat/pain).    Historical Provider, MD    Family History Family History  Problem Relation Age of Onset  . Cancer Paternal Grandmother     breast  . Hypertension Mother   . Heart disease Mother   . Hypertension Sister   . Diabetes Maternal Grandmother   . Kidney disease Maternal Grandmother     kidney failure  . Cancer Paternal Grandfather     lung  . Cancer Paternal Aunt     breast    Social History Social History  Substance Use Topics  . Smoking status: Current Some Day Smoker    Packs/day: 0.50    Years: 2.00    Types: Cigarettes  . Smokeless tobacco: Never Used  . Alcohol use No     Comment: Occasional     Allergies   Review of patient's allergies indicates no known allergies.   Review of Systems Review of Systems  Constitutional: Negative for chills and fever.  Genitourinary: Negative for difficulty urinating and dysuria.  Musculoskeletal: Positive for  arthralgias. Negative for joint swelling.       Right knee pain  Skin: Negative for color change and wound.  Neurological: Negative for weakness and numbness.  All other systems reviewed and are negative.    Physical Exam Updated Vital Signs BP 137/81 (BP Location: Right Arm)   Pulse 102   Temp 98.4 F (36.9 C) (Oral)   Resp 18   Ht  (1.676 m)   Wt 104.3 kg   LMP 03/08/2016   SpO2 100%   BMI 37.12 kg/m   Physical Exam  Constitutional: She is oriented to person, place, and time. She appears well-developed and well-nourished. No distress.  Cardiovascular: Normal rate, regular rhythm and intact distal pulses.   Pulmonary/Chest: Effort normal and breath sounds normal.  Musculoskeletal: She exhibits tenderness.  ttp of the  anterior right knee, crepitus on ROM.  No erythema, effusion, or step-off deformity.  DP pulse brisk, distal sensation intact. Calf is soft and NT.  Neurological: She is alert and oriented to person, place, and time. She exhibits normal muscle tone. Coordination normal.  Skin: Skin is warm and dry. No erythema.  Nursing note and vitals reviewed.    ED Treatments / Results  Labs (all labs ordered are listed, but only abnormal results are displayed) Labs Reviewed - No data to display  EKG  EKG Interpretation None       Radiology Dg Knee Complete 4 Views Right  Result Date: 03/15/2016 CLINICAL DATA:  Right knee pain in swelling EXAM: RIGHT KNEE - COMPLETE 4+ VIEW COMPARISON:  None. FINDINGS: No evidence of fracture, dislocation, or joint effusion. Sharpening the tibial spines noted. Soft tissues are unremarkable. IMPRESSION: Mild degenerative change. No acute findings Electronically Signed   By: Signa Kell M.D.   On: 03/15/2016 09:08    Procedures Procedures (including critical care time)  Medications Ordered in ED Medications  ibuprofen (ADVIL,MOTRIN) tablet 800 mg (not administered)     Initial Impression / Assessment and Plan / ED Course  I have reviewed the triage vital signs and the nursing notes.  Pertinent labs & imaging results that were available during my care of the patient were reviewed by me and considered in my medical decision making (see chart for details).  Clinical Course    Tenderness and crepitus of the right knee with flex/extension.  No erythema or edema of the joint.  Doubt septic joint.  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    ACE wrap applied, pain improved.  Pt weight bearing w/o difficulty.  Agrees to NSAID, ice, and ortho f/u.    Final Clinical Impressions(s) / ED Diagnoses   Final diagnoses:  Knee pain, acute, right    New Prescriptions New Prescriptions   No medications on file     Pauline Aus, PA-C 03/15/16 1610    Vanetta Mulders, MD 03/15/16 [phone]

## 2016-03-15 NOTE — ED Triage Notes (Signed)
Pt reports right knee pain x1 week. Pt denies any known injury. Pt able to bear weight and ambulated to room with steady gait. Mild swelling noted to right knee. nad noted. No deformity noted.

## 2016-03-21 ENCOUNTER — Encounter (HOSPITAL_COMMUNITY): Payer: Self-pay | Admitting: Emergency Medicine

## 2016-03-21 ENCOUNTER — Emergency Department (HOSPITAL_COMMUNITY)
Admission: EM | Admit: 2016-03-21 | Discharge: 2016-03-21 | Disposition: A | Discharge status: Home / Self Care | Payer: BLUE CROSS/BLUE SHIELD | Attending: Emergency Medicine | Admitting: Emergency Medicine

## 2016-03-21 DIAGNOSIS — Z87891 Personal history of nicotine dependence: Secondary | ICD-10-CM | POA: Insufficient documentation

## 2016-03-21 DIAGNOSIS — M25461 Effusion, right knee: Secondary | ICD-10-CM | POA: Diagnosis not present

## 2016-03-21 DIAGNOSIS — M25561 Pain in right knee: Secondary | ICD-10-CM | POA: Diagnosis not present

## 2016-03-21 MED ORDER — DEXAMETHASONE 4 MG PO TABS: 4.0000 mg | ORAL_TABLET | Freq: Two times a day (BID) | ORAL | 0 refills | Status: DC

## 2016-03-21 MED ORDER — DICLOFENAC SODIUM 75 MG PO TBEC: 75.0000 mg | DELAYED_RELEASE_TABLET | Freq: Two times a day (BID) | ORAL | 0 refills | Status: DC

## 2016-03-21 NOTE — ED Provider Notes (Signed)
[facility]-EMERG[facility]Y DEPT Provider Note   CSN: [id number] Arrival date & time: 03/21/16  1526  First Provider Contact:  First MD Initiated Contact with Patient 03/21/16 1627     By signing my name below, I, [name], attest that this documentation has been prepared under the direction and in the presence of [employer] Services[employer]obson Melayna Robarts PA-C.  Electronically Signed: Vista Minkobert Edwards, ED Scribe. 03/21/16. 4:44 PM.   History   Chief Complaint Chief Complaint  Patient presents with  . Knee Pain    HPI HPI Comments: [name] is a 30 y.o. female who presents to the Emergency Department complaining of gradually worsening pain and swelling to the right knee that has been persistent for the past two months. Pt states she was seen here in the ED on 03/15/16 for same symptoms and was prescribed a muscle relaxer. Pt states she has taken the medication as prescribed but reports no relief of symptoms. Pt reports that the pain and swelling has become much worse in past few days which is why she has returned to the ED for evaluation. Pt further states that the swelling in her right knee is worse than her last visit on 03/15/16. Pt describes the pain as a sharp pain intermittently; exacerbated by certain movements. Pt states she is on her feet a lot at work which causes her pain to increase. Pt denies any procedures to the knee. Pt denies any similar symptoms in the left knee.    The history is provided by the patient. No language interpreter was used.    Past Medical History:  Diagnosis Date  . Abnormal Pap smear   . Breast nodule 08/23/2014  . Infection    HSV2  . Vaginal Pap smear, abnormal     Patient Active Problem List   Diagnosis Date Noted  . Breast nodule 08/23/2014  . Encounter for IUD insertion 03/13/2014  . Preterm delivery 01/20/2014  . HSV-2 seropositive 12/08/2013    Past Surgical History:  Procedure Laterality Date  . NO PAST SURGERIES      OB History    Gravida Para Term Preterm AB Living   2 2 1 1   2    SAB TAB Ectopic Multiple Live Births           2       Home Medications    Prior to Admission medications   Medication Sig Start Date End Date Taking? Authorizing Provider  diclofenac (VOLTAREN) 75 MG EC tablet Take 1 tablet (75 mg total) by mouth 2 (two) times daily. Take with food 03/15/16   Tammy Triplett, PA-C  fexofenadine-pseudoephedrine (ALLEGRA-D) 60-120 MG 12 hr tablet Take 1 tablet by mouth 2 (two) times daily as needed. 10/14/15   Raeford RazorStephen Kohut, MD  levonorgestrel (MIRENA) 20 MCG/24HR IUD 1 each by Intrauterine route once.    Historical Provider, MD  Pseudoephedrine-APAP-DM (DAYQUIL MULTI-SYMPTOM COLD/FLU PO) Take 15-30 mLs by mouth daily as needed (for sore throat/pain).    Historical Provider, MD    Family History Family History  Problem Relation Age of Onset  . Cancer Paternal Grandmother     breast  . Hypertension Mother   . Heart disease Mother   . Hypertension Sister   . Diabetes Maternal Grandmother   . Kidney disease Maternal Grandmother     kidney failure  . Cancer Paternal Grandfather     lung  . Cancer Paternal Aunt     breast    Social History Social History  Substance Use Topics  .  Smoking status: Former Smoker    Packs/day: 0.50    Years: 2.00    Types: Cigarettes    Quit date: 11/14/2015  . Smokeless tobacco: Never Used  . Alcohol use Yes     Comment: Occasional     Allergies   Review of patient's allergies indicates no known allergies.   Review of Systems Review of Systems  Constitutional: Negative for fever.  Musculoskeletal: Positive for arthralgias (right knee).  All other systems reviewed and are negative.    Physical Exam Updated Vital Signs BP 123/90 (BP Location: Left Arm)   Pulse 99   Temp 98.8 F (37.1 C) (Oral)   Resp 18   Ht 5\' 6"  (1.676 m)   Wt 225 lb (102.1 kg)   LMP 03/08/2016   SpO2 100%   BMI 36.32 kg/m   Physical Exam  Constitutional: She is oriented to person, place, and time. She appears  well-developed and well-nourished. No distress.  HENT:  Head: Normocephalic and atraumatic.  Neck: Normal range of motion.  Cardiovascular: Normal rate, regular rhythm, normal heart sounds and intact distal pulses.   DORSALIS PEDIS 2+ POSTERIOR TIBIAL 2+ CAP REFILL <2 SEC BILATERALLY  Pulmonary/Chest: Effort normal and breath sounds normal. No respiratory distress.  Musculoskeletal: She exhibits edema and tenderness.  NO TEMPERATURE CHANGES OF LE NO QUADRICEPT DEFORMITY ON THE RIGHT PRE PATELLA PUFFYNESS ON THE RIGHT AND THE LEFT  NO TENDERNESS OF THE ANTEROIR TIBIAL TUBEROSITY ON THE RIGHT CREPITUS WITH RANGE OF MOTION OF THE RIGHT KNEE MILD EFFUSION OF THE RIGHT KNEE NO POSTERIOR MASS   Neurological: She is alert and oriented to person, place, and time.  Skin: Skin is warm and dry. She is not diaphoretic.  Psychiatric: She has a normal mood and affect. Judgment normal.  Nursing note and vitals reviewed.    ED Treatments / Results  DIAGNOSTIC STUDIES: Oxygen Saturation is 100% on RA, normal by my interpretation.  COORDINATION OF CARE: 4:42 PM-Discussed proper care instructions for pain and possible follow up with ortho. Discussed treatment plan with pt at bedside and pt agreed to plan.   Labs (all labs ordered are listed, but only abnormal results are displayed) Labs Reviewed - No data to display  EKG  EKG Interpretation None       Radiology No results found.  Procedures Procedures (including critical care time)  Medications Ordered in ED Medications - No data to display   Initial Impression / Assessment and Plan / ED Course  I have reviewed the triage vital signs and the nursing notes.  Pertinent labs & imaging results that were available during my care of the patient were reviewed by me and considered in my medical decision making (see chart for details).  Clinical Course    *I have reviewed nursing notes, vital signs, and all appropriate lab and  imaging results for this patient.**  Final Clinical Impressions(s) / ED Diagnoses Review of the xrays reveals degenerative changes of the right knee. Pt has a small effusion. No hot joint. No dislocation. Pt will use her knee immobilizer at home. She will use decadron and diclofenac until seen by orthopedics. Pt in agreement with plan.   Final diagnoses:  None    New Prescriptions New Prescriptions   No medications on file  **I personally performed the services described in this documentation, which was scribed in my presence. The recorded information has been reviewed and is accurate.Ivery Quale, PA-C 03/21/16 1707    Arlys John  Adriana Simas, MD 03/22/16 1436

## 2016-03-21 NOTE — Discharge Instructions (Signed)
YOur xray reveals degenerative changes of the right knee. You have a small amount of fluid(effusion) on the knee. It is important that you see an orthopedic MD concerning this issue. Please use your knee immobilizer when up and about. Use decadron and diclofenac daily with food.

## 2016-03-21 NOTE — ED Triage Notes (Signed)
Patient c/o right knee and swelling. Patient states has had it "for a while" in which she was seen here in ER and referred to Orthopedic Specialist. Per patient unable to follow-up. Patient states for past week she has had to be walking and standing more than normal at work and pain has gotten worse. Patient reports taking muscle relaxer that was prescribed here with no relief. Denies any change in tempature of leg or foot.

## 2016-09-02 ENCOUNTER — Encounter: Payer: Self-pay | Admitting: Adult Health

## 2016-09-02 ENCOUNTER — Other Ambulatory Visit: Payer: BLUE CROSS/BLUE SHIELD | Admitting: Adult Health

## 2016-10-20 ENCOUNTER — Other Ambulatory Visit: Payer: BLUE CROSS/BLUE SHIELD | Admitting: Adult Health

## 2016-10-20 ENCOUNTER — Encounter: Payer: Self-pay | Admitting: *Deleted

## 2017-02-24 ENCOUNTER — Encounter: Payer: Self-pay | Admitting: *Deleted

## 2017-02-24 ENCOUNTER — Other Ambulatory Visit: Payer: BLUE CROSS/BLUE SHIELD | Admitting: Women's Health

## 2017-03-03 ENCOUNTER — Encounter: Payer: Self-pay | Admitting: *Deleted

## 2017-03-03 ENCOUNTER — Other Ambulatory Visit: Payer: BLUE CROSS/BLUE SHIELD | Admitting: Women's Health

## 2017-06-07 ENCOUNTER — Other Ambulatory Visit: Payer: BLUE CROSS/BLUE SHIELD | Admitting: Adult Health

## 2017-06-07 ENCOUNTER — Encounter: Payer: Self-pay | Admitting: Adult Health

## 2017-06-29 ENCOUNTER — Ambulatory Visit (INDEPENDENT_AMBULATORY_CARE_PROVIDER_SITE_OTHER): Payer: BLUE CROSS/BLUE SHIELD | Admitting: Adult Health

## 2017-06-29 ENCOUNTER — Encounter: Payer: Self-pay | Admitting: Adult Health

## 2017-06-29 ENCOUNTER — Other Ambulatory Visit (HOSPITAL_COMMUNITY)
Admission: RE | Admit: 2017-06-29 | Discharge: 2017-06-29 | Disposition: A | Discharge status: Home / Self Care | Payer: BLUE CROSS/BLUE SHIELD | Source: Ambulatory Visit | Attending: Adult Health | Admitting: Adult Health

## 2017-06-29 VITALS — BP 120/76 | HR 96 | Ht 65.0 in | Wt 235.0 lb

## 2017-06-29 DIAGNOSIS — Z975 Presence of (intrauterine) contraceptive device: Secondary | ICD-10-CM

## 2017-06-29 DIAGNOSIS — Z01419 Encounter for gynecological examination (general) (routine) without abnormal findings: Secondary | ICD-10-CM

## 2017-06-29 MED ORDER — VALACYCLOVIR HCL 1 G PO TABS: 1000.0000 mg | ORAL_TABLET | Freq: Two times a day (BID) | ORAL | 3 refills | Status: DC

## 2017-06-29 NOTE — Addendum Note (Signed)
Addended by: Federico FlakeNES, PEGGY A on: 06/29/2017 02:54 PM   Modules accepted: Orders

## 2017-06-29 NOTE — Progress Notes (Signed)
Patient ID: [name], female   DOB: 1986-05-20, 31 y.o.   MRN: [id number] History of Present Illness:  [name] is a 31 year old black female in for well woman gyn exam and pap and requests refill on valtrex, just to have on hand, no recent outbreaks.   Current Medications, Allergies, Past Medical History, Past Surgical History, Family History and Social History were reviewed in [facility] electronic medical record.     Review of Systems: Patient denies any headaches, hearing loss, fatigue, blurred vision, shortness of breath, chest pain, abdominal pain, problems with bowel movements, urination, or intercourse. No joint pain or mood swings.    Physical Exam:BP 120/76 (BP Location: Left Arm, Patient Position: Sitting, Cuff Size: Large)   Pulse 96   Ht 5\' 5"  (1.651 m)   Wt 235 lb (106.6 kg)   BMI 39.11 kg/m  General:  Well developed, well nourished, no acute distress Skin:  Warm and dry Neck:  Midline trachea, normal thyroid, good ROM, no lymphadenopathy Lungs; Clear to auscultation bilaterally Breast:  No dominant palpable mass, retraction, or nipple discharge Cardiovascular: Regular rate and rhythm Abdomen:  Soft, non tender, no hepatosplenomegaly Pelvic:  External genitalia is normal in appearance, no lesions.  The vagina is normal in appearance. Urethra has no lesions or masses. The cervix is bulbous. +IUD strings, pap with HPV and CG/CHL performed.  Uterus is felt to be normal size, shape, and contour.  No adnexal masses or tenderness noted.Bladder is non tender, no masses felt. Extremities/musculoskeletal:  No swelling or varicosities noted, no clubbing or cyanosis Psych:  No mood changes, alert and cooperative,seems happy PHQ 9 score 4, has some days feels depressed but declines meds.  Impression:  1. Encounter for gynecological examination with Papanicolaou smear of cervix   2. IUD (intrauterine device) in place      Plan: Physical in 1 year Pap in 3 if normal Card  given for Dr Tracie HarrierHagler, she needs PCP Meds ordered this encounter  Medications  . valACYclovir (VALTREX) 1000 MG tablet    Sig: Take 1 tablet (1,000 mg total) by mouth 2 (two) times daily.    Dispense:  20 tablet    Refill:  3    Order Specific Question:   Supervising Provider    Answer:   Duane LopeEURE, LUTHER H [[id number]]

## 2017-07-05 LAB — CYTOLOGY - PAP
Chlamydia: NEGATIVE
Diagnosis: NEGATIVE
HPV (WINDOPATH): NOT DETECTED
NEISSERIA GONORRHEA: NEGATIVE

## 2017-11-12 DIAGNOSIS — F17200 Nicotine dependence, unspecified, uncomplicated: Secondary | ICD-10-CM | POA: Diagnosis not present

## 2017-11-12 DIAGNOSIS — R05 Cough: Secondary | ICD-10-CM | POA: Diagnosis not present

## 2017-11-12 DIAGNOSIS — Z720 Tobacco use: Secondary | ICD-10-CM | POA: Diagnosis not present

## 2017-11-12 DIAGNOSIS — J069 Acute upper respiratory infection, unspecified: Secondary | ICD-10-CM | POA: Diagnosis not present

## 2018-04-17 IMAGING — DX DG KNEE COMPLETE 4+V*R*
4 series · 4 of 4 positions shown · non-contrast
Comparison: None.

CLINICAL DATA: Right knee pain in swelling

EXAM:
RIGHT KNEE - COMPLETE 4+ VIEW

[knee ap]
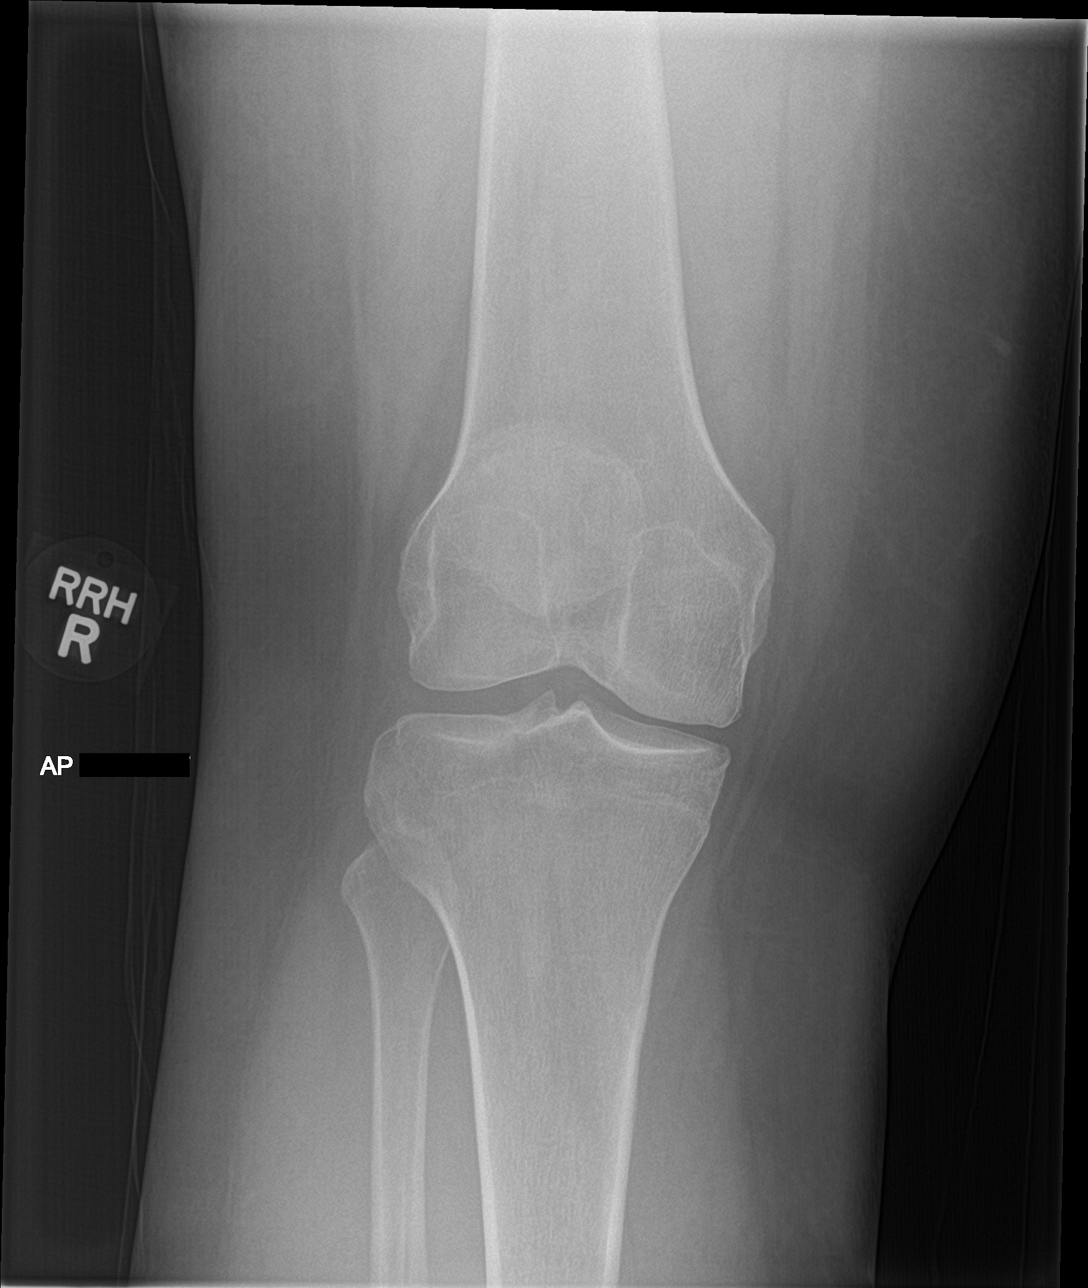

[tunnel]
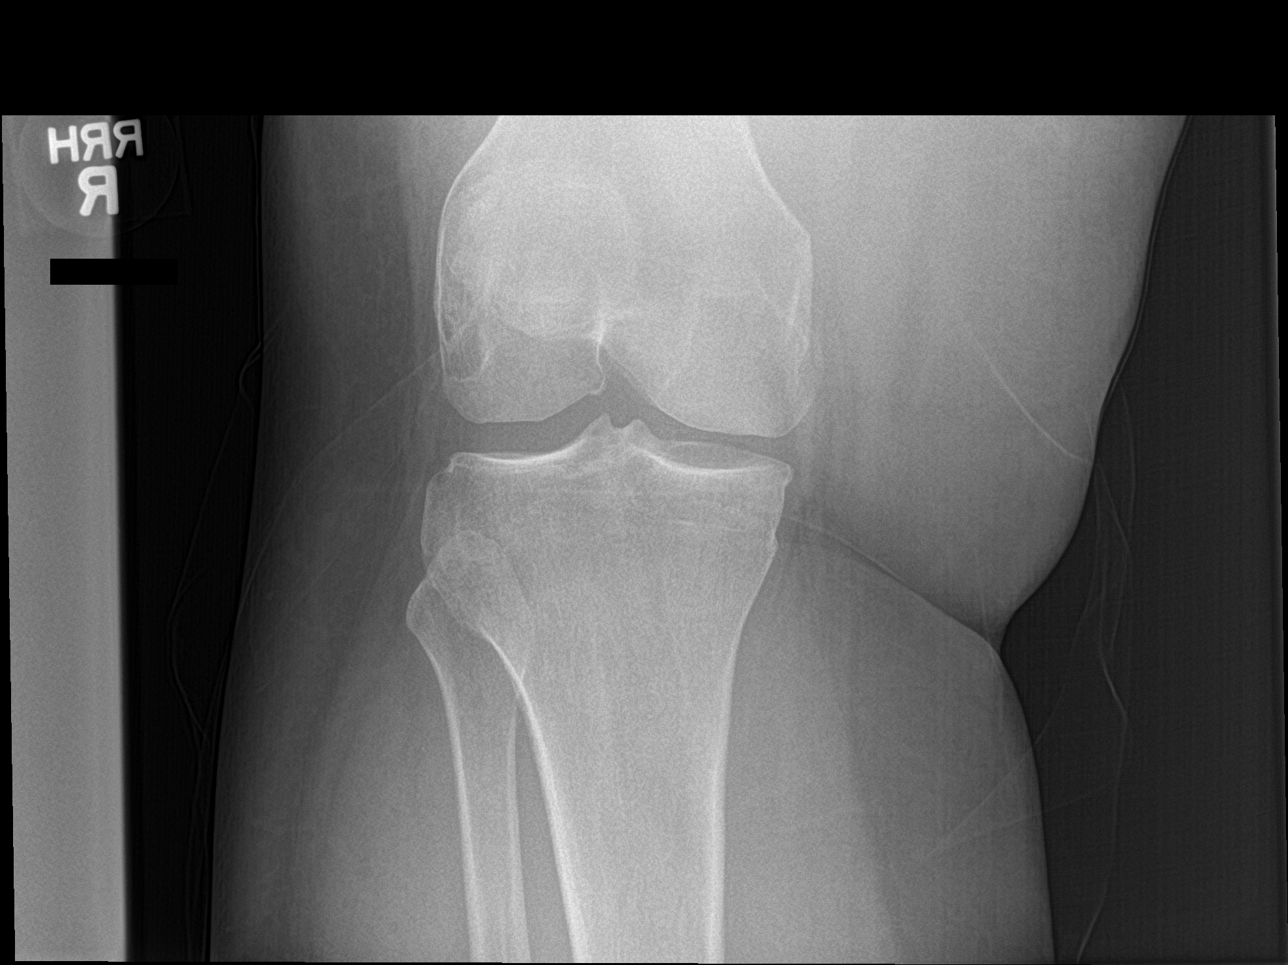

[knee lat]
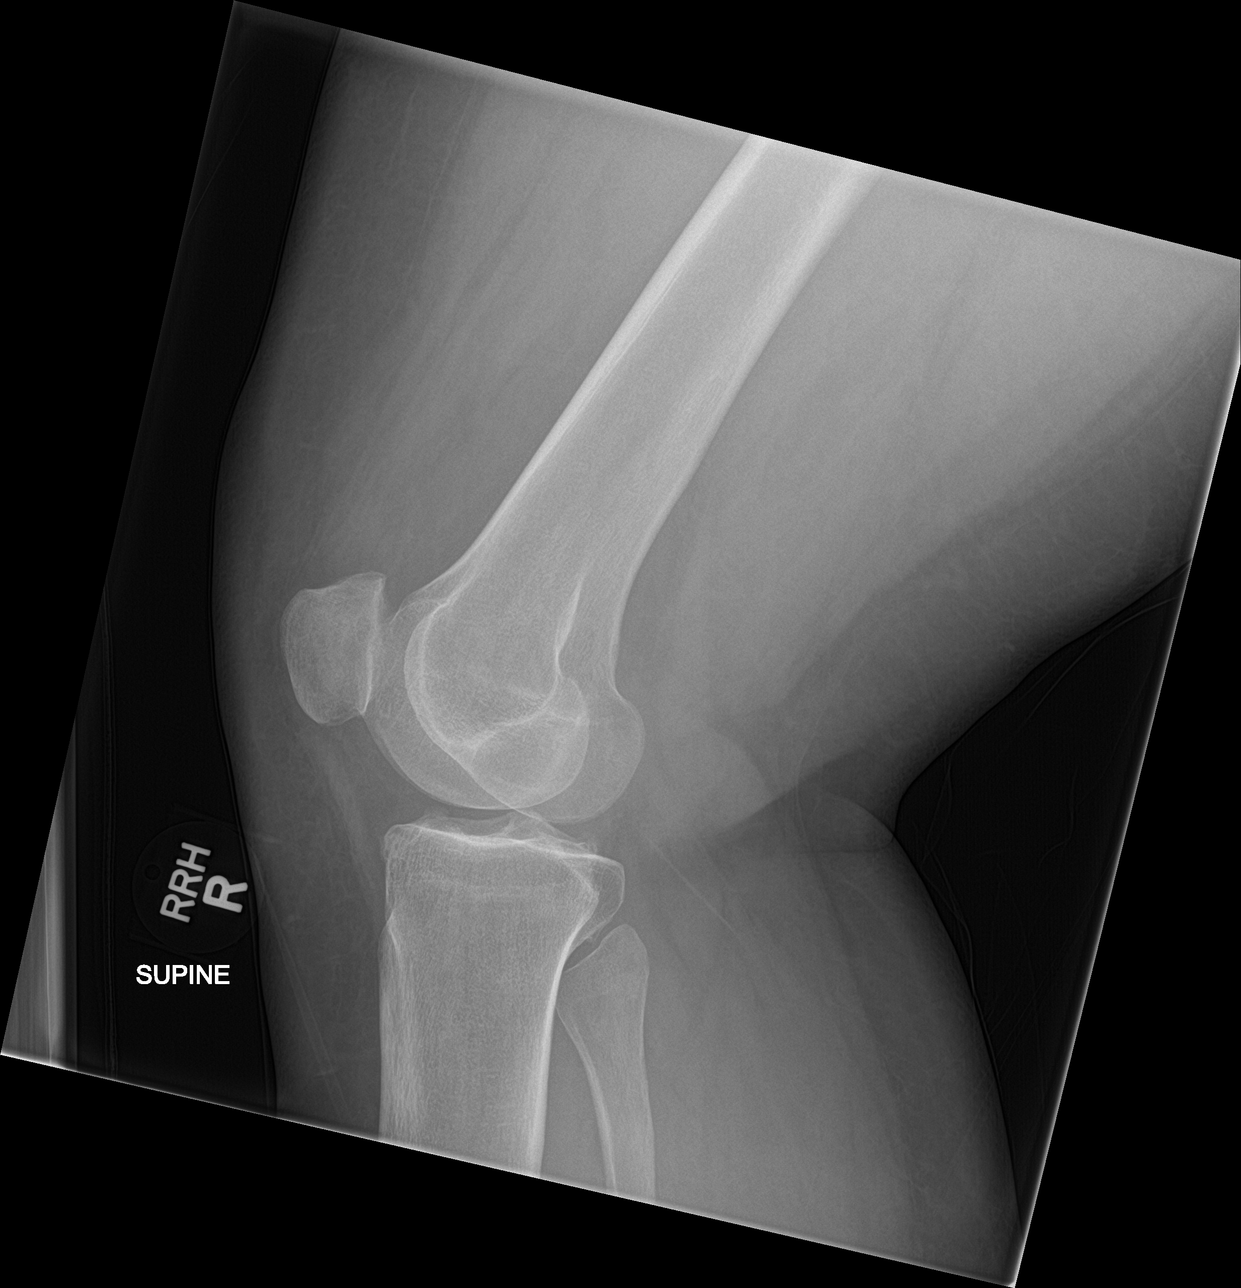

[knee sunrise]
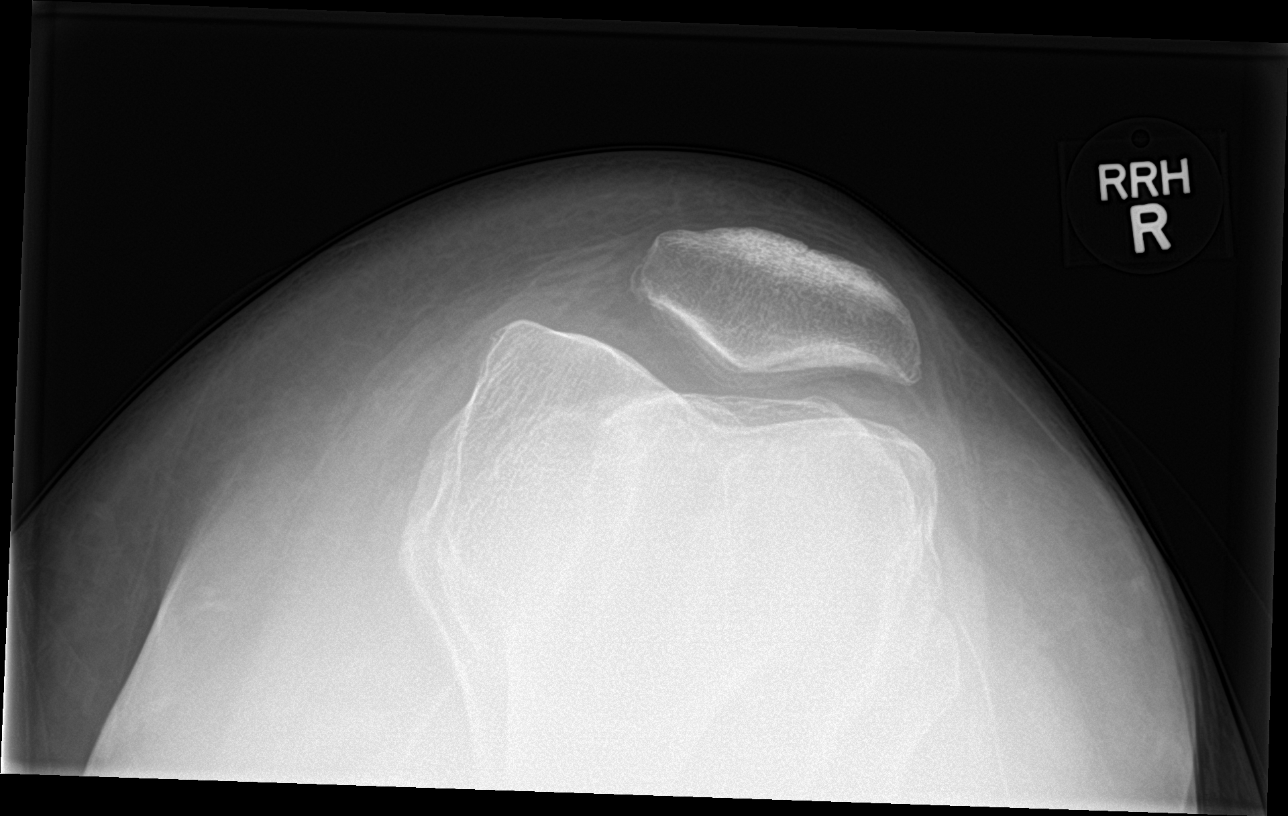

[4 of 4 positions shown; findings below may reference images not displayed]

FINDINGS: No evidence of fracture, dislocation, or joint effusion. Sharpening
the tibial spines noted. Soft tissues are unremarkable.
IMPRESSION: Mild degenerative change.

No acute findings

## 2018-08-11 DIAGNOSIS — R509 Fever, unspecified: Secondary | ICD-10-CM | POA: Diagnosis not present

## 2018-08-11 DIAGNOSIS — F17200 Nicotine dependence, unspecified, uncomplicated: Secondary | ICD-10-CM | POA: Diagnosis not present

## 2018-08-11 DIAGNOSIS — B9789 Other viral agents as the cause of diseases classified elsewhere: Secondary | ICD-10-CM | POA: Diagnosis not present

## 2018-08-11 DIAGNOSIS — R05 Cough: Secondary | ICD-10-CM | POA: Diagnosis not present

## 2018-08-11 DIAGNOSIS — J069 Acute upper respiratory infection, unspecified: Secondary | ICD-10-CM | POA: Diagnosis not present

## 2018-08-11 DIAGNOSIS — Z720 Tobacco use: Secondary | ICD-10-CM | POA: Diagnosis not present

## 2018-12-24 ENCOUNTER — Emergency Department (HOSPITAL_COMMUNITY)
Admission: EM | Admit: 2018-12-24 | Discharge: 2018-12-24 | Disposition: A | Discharge status: Home / Self Care | Payer: BLUE CROSS/BLUE SHIELD

## 2018-12-24 ENCOUNTER — Other Ambulatory Visit: Payer: Self-pay

## 2018-12-24 DIAGNOSIS — L089 Local infection of the skin and subcutaneous tissue, unspecified: Secondary | ICD-10-CM | POA: Diagnosis not present

## 2018-12-24 DIAGNOSIS — L910 Hypertrophic scar: Secondary | ICD-10-CM | POA: Diagnosis not present

## 2018-12-24 DIAGNOSIS — H60392 Other infective otitis externa, left ear: Secondary | ICD-10-CM | POA: Diagnosis not present

## 2018-12-24 DIAGNOSIS — H9202 Otalgia, left ear: Secondary | ICD-10-CM | POA: Diagnosis not present

## 2018-12-24 DIAGNOSIS — Z87891 Personal history of nicotine dependence: Secondary | ICD-10-CM | POA: Diagnosis not present

## 2019-01-04 ENCOUNTER — Telehealth: Payer: Self-pay | Admitting: Adult Health

## 2019-01-04 NOTE — Telephone Encounter (Signed)
Pt's IUD is good until 03/14/2019. Pt will call and schedule for end of July for removal and reinsertion. Pt voiced understanding. JSY

## 2019-01-04 NOTE — Telephone Encounter (Signed)
Pt would like an appt to have her birth control replaced.

## 2019-03-19 ENCOUNTER — Telehealth: Payer: Self-pay | Admitting: Advanced Practice Midwife

## 2019-03-19 NOTE — Telephone Encounter (Signed)
Pt wanting an appt for IUD removal and reinsertion. Ok to schedule? Or need pap as well? Last seen in 2018

## 2019-04-11 ENCOUNTER — Ambulatory Visit: Payer: BLUE CROSS/BLUE SHIELD | Admitting: Advanced Practice Midwife

## 2019-04-25 ENCOUNTER — Other Ambulatory Visit: Payer: Self-pay

## 2019-04-25 ENCOUNTER — Encounter: Payer: Self-pay | Admitting: Advanced Practice Midwife

## 2019-04-25 ENCOUNTER — Ambulatory Visit (INDEPENDENT_AMBULATORY_CARE_PROVIDER_SITE_OTHER): Payer: BC Managed Care – PPO | Admitting: Advanced Practice Midwife

## 2019-04-25 VITALS — BP 120/75 | HR 100 | Ht 65.0 in | Wt 236.0 lb

## 2019-04-25 DIAGNOSIS — Z30433 Encounter for removal and reinsertion of intrauterine contraceptive device: Secondary | ICD-10-CM

## 2019-04-25 DIAGNOSIS — Z3202 Encounter for pregnancy test, result negative: Secondary | ICD-10-CM

## 2019-04-25 DIAGNOSIS — Z30430 Encounter for insertion of intrauterine contraceptive device: Secondary | ICD-10-CM

## 2019-04-25 LAB — POCT URINE PREGNANCY: Preg Test, Ur: NEGATIVE

## 2019-04-25 MED ORDER — LEVONORGESTREL 20 MCG/24HR IU IUD
INTRAUTERINE_SYSTEM | Freq: Once | INTRAUTERINE | Status: AC
Start: 2019-04-25 — End: 2019-04-25
  Administered 2019-04-25: 1 via INTRAUTERINE

## 2019-04-25 NOTE — Progress Notes (Signed)
   IUD REMOVAL & RE-INSERTION Patient name: [name] MRN [id number]  Date of birth: 01/18/1986 Subjective Findings:   [name] is a 33 y.o. G21P1102 African American female being seen today for removal of a Mirena  IUD and insertion of a Mirena  IUD. Her IUD was placed on 03/13/2014.   No LMP recorded. (Menstrual status: IUD). Last sexual intercourse was unknown Last pap 06/29/2017. Results were:  normal  The risks and benefits of the method and placement have been thouroughly reviewed with the patient and all questions were answered.  Specifically the patient is aware of failure rate of 08/998, expulsion of the IUD and of possible perforation.  The patient is aware of irregular bleeding due to the method and understands the incidence of irregular bleeding diminishes with time.  Signed copy of informed consent in chart.  Pertinent History Reviewed:   Reviewed past medical,surgical, social, obstetrical and family history.  Reviewed problem list, medications and allergies. Objective Findings & Procedure:    Vitals:   04/25/19 1420  BP: 120/75  Pulse: 100  Weight: 236 lb (107 kg)  Height: 5\' 5"  (1.651 m)  Body mass index is 39.27 kg/m.  Results for orders placed or performed in visit on 04/25/19 (from the past 24 hour(s))  POCT urine pregnancy   Collection Time: 04/25/19  2:22 PM  Result Value Ref Range   Preg Test, Ur Negative Negative     Time out was performed.  A Graves speculum was placed in the vagina.  The cervix was visualized, prepped using Betadine. The strings were visible. They were grasped and the Mirena IUD was easily removed. The cervix was then grasped with a single-tooth tenaculum. The uterus was found to be anteroflexed and it sounded to 8 cm.  Mirena  IUD placed per manufacturer's recommendations without complications. The strings were trimmed to approximately 2-3 cm.  The patient tolerated the procedure well.   Assessment & Plan:   1) Mirena IUD removal & Mirena    insertion The patient was given post procedure instructions, including signs and symptoms of infection and to check for the strings after each menses or each month, and refraining from intercourse or anything in the vagina for 3 days. She was given a care card with date IUD placed, and date IUD to be removed. She is scheduled for a f/u appointment in 4 weeks.  Orders Placed This Encounter  Procedures  . POCT urine pregnancy    Follow-up: Will have 4wk virtual visit to make sure she can feel the strings.  Return in about 1 year (around 04/24/2020) for Pap & Physical.  Myrtis Ser Ku [facility] 04/25/2019 2:52 PM

## 2019-04-25 NOTE — Patient Instructions (Signed)

## 2019-05-23 ENCOUNTER — Ambulatory Visit: Payer: BC Managed Care – PPO | Admitting: Advanced Practice Midwife

## 2019-09-24 ENCOUNTER — Emergency Department (HOSPITAL_COMMUNITY)
Admission: EM | Admit: 2019-09-24 | Discharge: 2019-09-24 | Disposition: A | Discharge status: Home / Self Care | Payer: Managed Care, Other (non HMO) | Attending: Emergency Medicine | Admitting: Emergency Medicine

## 2019-09-24 ENCOUNTER — Other Ambulatory Visit: Payer: Self-pay

## 2019-09-24 ENCOUNTER — Encounter (HOSPITAL_COMMUNITY): Payer: Self-pay | Admitting: Emergency Medicine

## 2019-09-24 DIAGNOSIS — S61451A Open bite of right hand, initial encounter: Secondary | ICD-10-CM

## 2019-09-24 DIAGNOSIS — W540XXA Bitten by dog, initial encounter: Secondary | ICD-10-CM | POA: Diagnosis not present

## 2019-09-24 DIAGNOSIS — Y999 Unspecified external cause status: Secondary | ICD-10-CM | POA: Insufficient documentation

## 2019-09-24 DIAGNOSIS — Y929 Unspecified place or not applicable: Secondary | ICD-10-CM | POA: Diagnosis not present

## 2019-09-24 DIAGNOSIS — F17210 Nicotine dependence, cigarettes, uncomplicated: Secondary | ICD-10-CM | POA: Diagnosis not present

## 2019-09-24 DIAGNOSIS — Z23 Encounter for immunization: Secondary | ICD-10-CM | POA: Diagnosis not present

## 2019-09-24 DIAGNOSIS — Y939 Activity, unspecified: Secondary | ICD-10-CM | POA: Diagnosis not present

## 2019-09-24 DIAGNOSIS — T148XXA Other injury of unspecified body region, initial encounter: Secondary | ICD-10-CM

## 2019-09-24 DIAGNOSIS — S61206A Unspecified open wound of right little finger without damage to nail, initial encounter: Secondary | ICD-10-CM | POA: Diagnosis not present

## 2019-09-24 MED ORDER — TETANUS-DIPHTH-ACELL PERTUSSIS 5-2.5-18.5 LF-MCG/0.5 IM SUSP
0.5000 mL | Freq: Once | INTRAMUSCULAR | Status: AC
  Administered 2019-09-24: 0.5 mL via INTRAMUSCULAR
  Filled 2019-09-24: qty 0.5

## 2019-09-24 MED ORDER — AMOXICILLIN-POT CLAVULANATE 875-125 MG PO TABS
1.0000 | ORAL_TABLET | Freq: Once | ORAL | Status: AC
  Administered 2019-09-24: 1 via ORAL
  Filled 2019-09-24: qty 1

## 2019-09-24 MED ORDER — AMOXICILLIN-POT CLAVULANATE 875-125 MG PO TABS: 1.0000 | ORAL_TABLET | Freq: Two times a day (BID) | ORAL | 0 refills | Status: DC

## 2019-09-24 NOTE — ED Notes (Signed)
Animal Control Contacted per policy and stated someone would be by to fill out a report.

## 2019-09-24 NOTE — ED Triage Notes (Signed)
Pt states she was bitten by her moms' dog approximately 30 mins ago. Pt has some bleeding noted around the fingernail on 5 th digit R hand as well as a bite area to lower part of same finger. Per pt; "thinks dog has been vaccinated".

## 2019-09-24 NOTE — ED Provider Notes (Signed)
[facility] EMERGENCY DEPARTMENT Provider Note   CSN: [id number] Arrival date & time: 09/24/19  0410     History Chief Complaint  Patient presents with  . Animal Bite    [name] is a 34 y.o. female.  The history is provided by the patient.  Animal Bite Contact animal:  Dog Location:  Finger Finger injury location:  R little finger Time since incident:  30 minutes Pain details:    Quality:  Aching   Severity:  Severe   Timing:  Constant   Progression:  Worsening Provoked: unprovoked   Animal's rabies vaccination status:  Up to date Animal in possession: yes   Tetanus status:  Unknown Relieved by:  Rest Worsened by:  Activity Associated symptoms: no fever and no numbness    Patient reports she was bit in her right little finger by a dog.  She reports that her mother's old pitbull bit her on the right pinky finger.  She has had bleeding.  She is had pain in finger.  No other injuries reported.  She is not diabetic    Past Medical History:  Diagnosis Date  . Abnormal Pap smear   . Breast nodule 08/23/2014  . Infection    HSV2  . Vaginal Pap smear, abnormal     Patient Active Problem List   Diagnosis Date Noted  . Breast nodule 08/23/2014  . Encounter for IUD insertion 03/13/2014  . Preterm delivery 01/20/2014  . HSV-2 seropositive 12/08/2013    Past Surgical History:  Procedure Laterality Date  . NO PAST SURGERIES       OB History    Gravida  2   Para  2   Term  1   Preterm  1   AB      Living  2     SAB      TAB      Ectopic      Multiple      Live Births  2           Family History  Problem Relation Age of Onset  . Cancer Paternal Grandmother        breast  . Hypertension Mother   . Heart disease Mother   . Hypertension Sister   . Diabetes Maternal Grandmother   . Kidney disease Maternal Grandmother        kidney failure  . Cancer Paternal Grandfather        lung  . Cancer Paternal Aunt        breast    Social  History   Tobacco Use  . Smoking status: Current Some Day Smoker    Packs/day: 0.50    Years: 2.00    Pack years: 1.00    Types: Cigarettes    Last attempt to quit: 11/14/2015    Years since quitting: 3.8  . Smokeless tobacco: Never Used  Substance Use Topics  . Alcohol use: Yes    Comment: Occasional  . Drug use: No    Home Medications Prior to Admission medications   Medication Sig Start Date End Date Taking? Authorizing Provider  amoxicillin-clavulanate (AUGMENTIN) 875-125 MG tablet Take 1 tablet by mouth 2 (two) times daily. One po bid x 7 days 09/24/19   Ripley Fraise, MD  levonorgestrel [facility]) 20 MCG/24HR IUD 1 each by Intrauterine route once.    [provider]  valACYclovir (VALTREX) 1000 MG tablet Take 1 tablet (1,000 mg total) by mouth 2 (two) times daily. Patient not taking:  Reported on 04/25/2019 06/29/17   Adline Potter, NP    Allergies    Patient has no known allergies.  Review of Systems   Review of Systems  Constitutional: Negative for fever.  Skin: Positive for wound.  Neurological: Negative for weakness and numbness.    Physical Exam Updated Vital Signs BP 116/71 (BP Location: Left Arm)   Pulse 97   Temp 98.3 F (36.8 C) (Oral)   Resp 18   Ht 1.676 m (5\' 6" )   Wt 104.3 kg   SpO2 97%   BMI 37.12 kg/m   Physical Exam CONSTITUTIONAL: Well developed/well nourished HEAD: Normocephalic/atraumatic EYES: EOMI ENMT: Mucous membranes moist NECK: supple no meningeal signs LUNGS:no apparent distress ABDOMEN: soft NEURO: Pt is awake/alert/appropriate, moves all extremitiesx4.  No facial droop.   EXTREMITIES: pulses normal/equal, full ROM, wounds noted to right little finger.  See photo below No bony tenderness to right middle finger.  Nail is intact, but difficult to determine completely due to nail coloring Full range of motion of right little finger with full flexion extension intact.  She denies any sensory deficit SKIN: warm, color  normal PSYCH: no abnormalities of mood noted, alert and oriented to situation        ED Results / Procedures / Treatments   Labs (all labs ordered are listed, but only abnormal results are displayed) Labs Reviewed - No data to display  EKG None  Radiology No results found.  Procedures Procedures (including critical care time)  Medications Ordered in ED Medications  Tdap (BOOSTRIX) injection 0.5 mL (0.5 mLs Intramuscular Given 09/24/19 0510)  amoxicillin-clavulanate (AUGMENTIN) 875-125 MG per tablet 1 tablet (1 tablet Oral Given 09/24/19 09/26/19)    ED Course  I have reviewed the triage vital signs and the nursing notes.     MDM Rules/Calculators/A&P                      We will treat with Augmentin. We discussed wound care and reasons to return to the ER Final Clinical Impression(s) / ED Diagnoses Final diagnoses:  Animal bite    Rx / DC Orders ED Discharge Orders         Ordered    amoxicillin-clavulanate (AUGMENTIN) 875-125 MG tablet  2 times daily     09/24/19 0448           09/26/19, MD 09/24/19 [phone]

## 2019-11-23 ENCOUNTER — Ambulatory Visit
Admission: EM | Admit: 2019-11-23 | Discharge: 2019-11-23 | Disposition: A | Discharge status: Home / Self Care | Payer: Managed Care, Other (non HMO) | Attending: Emergency Medicine | Admitting: Emergency Medicine

## 2019-11-23 ENCOUNTER — Other Ambulatory Visit: Payer: Self-pay

## 2019-11-23 ENCOUNTER — Ambulatory Visit (INDEPENDENT_AMBULATORY_CARE_PROVIDER_SITE_OTHER): Payer: Managed Care, Other (non HMO)

## 2019-11-23 DIAGNOSIS — W19XXXA Unspecified fall, initial encounter: Secondary | ICD-10-CM

## 2019-11-23 DIAGNOSIS — M79671 Pain in right foot: Secondary | ICD-10-CM

## 2019-11-23 DIAGNOSIS — M25562 Pain in left knee: Secondary | ICD-10-CM | POA: Diagnosis not present

## 2019-11-23 DIAGNOSIS — Y990 Civilian activity done for income or pay: Secondary | ICD-10-CM

## 2019-11-23 MED ORDER — NAPROXEN 500 MG PO TABS: 500.0000 mg | ORAL_TABLET | Freq: Two times a day (BID) | ORAL | 0 refills | Status: DC

## 2019-11-23 NOTE — ED Triage Notes (Signed)
Pt present with falling yesterday at work after slipping up. Reports that she is now having left knee pain and right foot pain. Reports she has history of knee problems. Pt is ambulatory.

## 2019-11-23 NOTE — Discharge Instructions (Signed)
X-rays negative for fracture or dislocation; radiologist interpretation pending Continue conservative management of rest, ice, and elevation Knee brace given in office Take naproxen as needed for pain relief (may cause abdominal discomfort, ulcers, and GI bleeds avoid taking with other NSAIDs) Follow up with occupational health for further evaluation and work restrictions Return or go to the ER if you have any new or worsening symptoms (fever, chills, chest pain, redness, swelling, bruising, deformity, worsening symptoms despite medication, etc...)

## 2019-11-23 NOTE — ED Provider Notes (Signed)
[facility]   [id number] 11/23/19 Arrival Time: 0907  CC: LT knee and RT foot pain  SUBJECTIVE: History from: patient. [name] is a 34 y.o. female complains of LT knee and RT foot pain that occurred 1 day.  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  States she stepped on a metal pole underneath the line and twisted her LT knee and fell on the outside of her RT foot.  Localizes the pain to the front of knee and outside of foot.  Describes the pain as intermittent, achy, and throbbing in LT knee, and intermittent and sharp in RT foot. Has NOT tried OTC medications.  Symptoms are made worse with walking.  Reports previous LT knee problems in the past.  Denies fever, chills, erythema, ecchymosis, effusion, weakness, numbness and tingling.  ROS: As per HPI.  All other pertinent ROS negative.     Past Medical History:  Diagnosis Date  . Abnormal Pap smear   . Breast nodule 08/23/2014  . Infection    HSV2  . Vaginal Pap smear, abnormal    Past Surgical History:  Procedure Laterality Date  . NO PAST SURGERIES     No Known Allergies No current facility-administered medications on file prior to encounter.   Current Outpatient Medications on File Prior to Encounter  Medication Sig Dispense Refill  . levonorgestrel (MIRENA) 20 MCG/24HR IUD 1 each by Intrauterine route once.     Social History   Socioeconomic History  . Marital status: Single    Spouse name: Not on file  . Number of children: Not on file  . Years of education: Not on file  . Highest education level: Not on file  Occupational History  . Not on file  Tobacco Use  . Smoking status: Current Some Day Smoker    Packs/day: 0.50    Years: 2.00    Pack years: 1.00    Types: Cigarettes    Last attempt to quit: 11/14/2015    Years since quitting: 4.0  . Smokeless tobacco: Never Used  Substance and Sexual Activity  . Alcohol use: Yes    Comment: Occasional  . Drug use:  No  . Sexual activity: Yes    Birth control/protection: I.U.D.  Other Topics Concern  . Not on file  Social History Narrative  . Not on file   Social Determinants of Health   Financial Resource Strain:   . Difficulty of Paying Living Expenses:   Food Insecurity:   . Worried About Charity fundraiser in the Last Year:   . Arboriculturist in the Last Year:   Transportation Needs:   . Film/video editor (Medical):   Marland Kitchen Lack of Transportation (Non-Medical):   Physical Activity:   . Days of Exercise per Week:   . Minutes of Exercise per Session:   Stress:   . Feeling of Stress :   Social Connections:   . Frequency of Communication with Friends and Family:   . Frequency of Social Gatherings with Friends and Family:   . Attends Religious Services:   . Active Member of Clubs or Organizations:   . Attends Archivist Meetings:   Marland Kitchen Marital Status:   Intimate Partner Violence:   . Fear of Current or Ex-Partner:   . Emotionally Abused:   Marland Kitchen Physically Abused:   . Sexually Abused:    Family History  Problem Relation Age of Onset  . Cancer Paternal Grandmother  breast  . Hypertension Mother   . Heart disease Mother   . Hypertension Sister   . Diabetes Maternal Grandmother   . Kidney disease Maternal Grandmother        kidney failure  . Cancer Paternal Grandfather        lung  . Cancer Paternal Aunt        breast    OBJECTIVE:  Vitals:   11/23/19 0922  BP: 116/74  Pulse: 86  Resp: 18  Temp: 97.7 F (36.5 C)  SpO2: 97%    General appearance: ALERT; in no acute distress.  Head: NCAT Lungs: Normal respiratory effort CV: RT dorsalis pedis pulses 2+ . Cap refill < 2 seconds Musculoskeletal: LT knee and RT foot Inspection: Skin warm, dry, clear and intact without obvious erythema, effusion, or ecchymosis.  Palpation: unable to reproduce symptoms on exam for LT knee pain; TTP over proximal 5th MT ROM: FROM active and passive Strength:  5/5 knee  abduction, 5/5 knee adduction, 5/5 knee flexion, 5/5 knee extension Stability: anterior and posterior drawer sign intact for LT knee Skin: warm and dry Neurologic: Ambulates without difficulty; Sensation intact about the lower extremities Psychological: alert and cooperative; normal mood and affect  DIAGNOSTIC STUDIES:  DG Foot Complete Right  Result Date: 11/23/2019 CLINICAL DATA:  Larey Seat at work. Foot pain. EXAM: RIGHT FOOT COMPLETE - 3+ VIEW COMPARISON:  None. FINDINGS: The joint spaces are maintained. No acute fracture is identified. IMPRESSION: No acute bony findings. Electronically Signed   By: Rudie Meyer M.D.   On: 11/23/2019 10:04     X-rays negative for bony abnormalities including fracture, or dislocation.  No soft tissue swelling.    I have reviewed the x-rays myself and the radiologist interpretation. I am in agreement with the radiologist interpretation.     ASSESSMENT & PLAN:  1. Acute pain of left knee   2. Right foot pain   3. Work related injury   4. Fall, initial encounter     Meds ordered this encounter  Medications  . naproxen (NAPROSYN) 500 MG tablet    Sig: Take 1 tablet (500 mg total) by mouth 2 (two) times daily.    Dispense:  30 tablet    Refill:  0    Order Specific Question:   Supervising Provider    Answer:   LOUINE, TENPENNY [[id number]]   X-rays negative for fracture or dislocation; radiologist interpretation pending Continue conservative management of rest, ice, and elevation Knee brace given in office Take naproxen as needed for pain relief (may cause abdominal discomfort, ulcers, and GI bleeds avoid taking with other NSAIDs) Follow up with occupational health for further evaluation and work restrictions Return or go to the ER if you have any new or worsening symptoms (fever, chills, chest pain, redness, swelling, bruising, deformity, worsening symptoms despite medication, etc...)   Reviewed expectations re: course of current medical issues.  Questions answered. Outlined signs and symptoms indicating need for more acute intervention. Patient verbalized understanding. After Visit Summary given.    Rennis Harding, PA-C 11/23/19 1030

## 2020-01-01 ENCOUNTER — Ambulatory Visit
Admission: EM | Admit: 2020-01-01 | Discharge: 2020-01-01 | Disposition: A | Discharge status: Home / Self Care | Payer: Managed Care, Other (non HMO) | Attending: Emergency Medicine | Admitting: Emergency Medicine

## 2020-01-01 ENCOUNTER — Other Ambulatory Visit: Payer: Self-pay

## 2020-01-01 ENCOUNTER — Ambulatory Visit (INDEPENDENT_AMBULATORY_CARE_PROVIDER_SITE_OTHER): Payer: Managed Care, Other (non HMO)

## 2020-01-01 ENCOUNTER — Encounter: Payer: Self-pay | Admitting: Emergency Medicine

## 2020-01-01 DIAGNOSIS — Z833 Family history of diabetes mellitus: Secondary | ICD-10-CM | POA: Insufficient documentation

## 2020-01-01 DIAGNOSIS — W19XXXA Unspecified fall, initial encounter: Secondary | ICD-10-CM | POA: Diagnosis not present

## 2020-01-01 DIAGNOSIS — N898 Other specified noninflammatory disorders of vagina: Secondary | ICD-10-CM | POA: Diagnosis not present

## 2020-01-01 DIAGNOSIS — Z113 Encounter for screening for infections with a predominantly sexual mode of transmission: Secondary | ICD-10-CM | POA: Diagnosis not present

## 2020-01-01 DIAGNOSIS — S3992XA Unspecified injury of lower back, initial encounter: Secondary | ICD-10-CM

## 2020-01-01 DIAGNOSIS — Y990 Civilian activity done for income or pay: Secondary | ICD-10-CM | POA: Diagnosis not present

## 2020-01-01 DIAGNOSIS — Z8249 Family history of ischemic heart disease and other diseases of the circulatory system: Secondary | ICD-10-CM | POA: Insufficient documentation

## 2020-01-01 DIAGNOSIS — M533 Sacrococcygeal disorders, not elsewhere classified: Secondary | ICD-10-CM

## 2020-01-01 DIAGNOSIS — F17210 Nicotine dependence, cigarettes, uncomplicated: Secondary | ICD-10-CM | POA: Insufficient documentation

## 2020-01-01 LAB — POCT URINE PREGNANCY: Preg Test, Ur: NEGATIVE

## 2020-01-01 MED ORDER — MELOXICAM 15 MG PO TABS: 15.0000 mg | ORAL_TABLET | Freq: Every day | ORAL | 0 refills | Status: DC

## 2020-01-01 NOTE — Discharge Instructions (Signed)
Continue conservative management of rest, and ice Sit with a donut or seat cushion Take mobic as directed for pain and/or swelling Follow up with PCP or with orthopedist if symptoms persists or worsen Return or go to the ER if you have any new or worsening symptoms (fever, chills, chest pain, abdominal pain, changes in bowel or bladder habits, pain radiating into lower legs, numbness or tingling in pelvis, etc...)

## 2020-01-01 NOTE — ED Provider Notes (Signed)
[facility]   [id number] 01/01/20 Arrival Time: 1752  CC: Tailbone pain  SUBJECTIVE: History from: patient. [name] is a 34 y.o. female complains of tailbone pain that began 1 week.  Was working in normal capacity and tripped and fell over pallet landing on bottom. Localizes the pain to the tailbone.  Describes the pain as constant and burning in character.  6/10.  Has tried OTC medications without relief.  Symptoms are made worse with sitting.  Denies similar symptoms in the past.  Denies fever, chills, erythema, ecchymosis, effusion, weakness, numbness and tingling, saddle paresthesias, loss of bowel or bladder function.      Patient also requests STD testing today.  Complain abnormal vaginal discharge.  Recent unprotected sex.  Would like to be tested.    ROS: As per HPI.  All other pertinent ROS negative.     Past Medical History:  Diagnosis Date  . Abnormal Pap smear   . Breast nodule 08/23/2014  . Infection    HSV2  . Vaginal Pap smear, abnormal    Past Surgical History:  Procedure Laterality Date  . NO PAST SURGERIES     No Known Allergies No current facility-administered medications on file prior to encounter.   Current Outpatient Medications on File Prior to Encounter  Medication Sig Dispense Refill  . levonorgestrel (MIRENA) 20 MCG/24HR IUD 1 each by Intrauterine route once.     Social History   Socioeconomic History  . Marital status: Single    Spouse name: Not on file  . Number of children: Not on file  . Years of education: Not on file  . Highest education level: Not on file  Occupational History  . Not on file  Tobacco Use  . Smoking status: Current Some Day Smoker    Packs/day: 0.50    Years: 2.00    Pack years: 1.00    Types: Cigarettes    Last attempt to quit: 11/14/2015    Years since quitting: 4.1  . Smokeless tobacco: Never Used  Substance and Sexual Activity  . Alcohol use: Yes    Comment: Occasional  . Drug use: No  . Sexual  activity: Yes    Birth control/protection: I.U.D.  Other Topics Concern  . Not on file  Social History Narrative  . Not on file   Social Determinants of Health   Financial Resource Strain:   . Difficulty of Paying Living Expenses:   Food Insecurity:   . Worried About Programme researcher, broadcasting/film/video in the Last Year:   . Barista in the Last Year:   Transportation Needs:   . Freight forwarder (Medical):   Marland Kitchen Lack of Transportation (Non-Medical):   Physical Activity:   . Days of Exercise per Week:   . Minutes of Exercise per Session:   Stress:   . Feeling of Stress :   Social Connections:   . Frequency of Communication with Friends and Family:   . Frequency of Social Gatherings with Friends and Family:   . Attends Religious Services:   . Active Member of Clubs or Organizations:   . Attends Banker Meetings:   Marland Kitchen Marital Status:   Intimate Partner Violence:   . Fear of Current or Ex-Partner:   . Emotionally Abused:   Marland Kitchen Physically Abused:   . Sexually Abused:    Family History  Problem Relation Age of Onset  . Cancer Paternal Grandmother        breast  . Hypertension  Mother   . Heart disease Mother   . Hypertension Sister   . Diabetes Maternal Grandmother   . Kidney disease Maternal Grandmother        kidney failure  . Cancer Paternal Grandfather        lung  . Cancer Paternal Aunt        breast    OBJECTIVE:  Vitals:   01/01/20 1805  BP: 107/69  Pulse: 96  Resp: 18  Temp: 98.8 F (37.1 C)  TempSrc: Oral  SpO2: 97%  Weight: 230 lb (104.3 kg)  Height: 5\' 6"  (1.676 m)    General appearance: ALERT; in no acute distress.  Head: NCAT Lungs: Normal respiratory effort Musculoskeletal: Back  Inspection: Skin warm, dry, clear and intact without obvious erythema, effusion, or ecchymosis.  Palpation: mildly TTP over lower back ROM: FROM active and passive Strength: 5/5 shld abduction, 5/5 shld adduction, 5/5 elbow flexion, 5/5 elbow extension, 5/5  grip strength, 5/5 hip flexion, 5/5 hip extension Skin: warm and dry Neurologic: Ambulates without difficulty; Sensation intact about the upper/ lower extremities Psychological: alert and cooperative; normal mood and affect  DIAGNOSTIC STUDIES:  DG Sacrum/Coccyx  Result Date: 01/01/2020 CLINICAL DATA:  Fall at work 5 days ago. Sacrococcygeal pain. Initial encounter. EXAM: SACRUM AND COCCYX - 2+ VIEW COMPARISON:  None. FINDINGS: There is no evidence of fracture or other focal bone lesions. IUD noted within the mid pelvis. IMPRESSION: Negative. Electronically Signed   By: Marlaine Hind M.D.   On: 01/01/2020 18:52    X-rays negative for bony abnormalities including fracture, or dislocation.    I have reviewed the x-rays myself and the radiologist interpretation. I am in agreement with the radiologist interpretation.     ASSESSMENT & PLAN:  1. Injury of coccyx, initial encounter   2. Work related injury   3. Fall, initial encounter   4. Screening examination for venereal disease   5. Vaginal discharge     Meds ordered this encounter  Medications  . meloxicam (MOBIC) 15 MG tablet    Sig: Take 1 tablet (15 mg total) by mouth daily.    Dispense:  30 tablet    Refill:  0    Order Specific Question:   Supervising Provider    Answer:   DELLENE, MCGROARTY [[id number]]    Continue conservative management of rest, and ice Sit with a donut or seat cushion Take mobic as directed for pain and/or swelling Follow up with PCP or with orthopedist if symptoms persists or worsen Return or go to the ER if you have any new or worsening symptoms (fever, chills, chest pain, abdominal pain, changes in bowel or bladder habits, pain radiating into lower legs, numbness or tingling in pelvis, etc...)    Vaginal swab obtained.   We will follow up with you regarding the results of your test If tests are positive, please abstain from sexual activity for at least 7 days and notify partners Follow up with PCP if  symptoms persists Return here or go to ER if you have any new or worsening symptoms fever, chills, nausea, vomiting, abdominal pain, vaginal discharge, odor, pain, etc...   Reviewed expectations re: course of current medical issues. Questions answered. Outlined signs and symptoms indicating need for more acute intervention. Patient verbalized understanding. After Visit Summary given.    Lestine Box, PA-C 01/01/20 1909

## 2020-01-01 NOTE — ED Triage Notes (Signed)
Tripped and fell over a wood pallet at work last week.  Today pt is having pain and burning in her tailbone area.

## 2020-01-02 ENCOUNTER — Telehealth: Payer: Self-pay

## 2020-01-02 DIAGNOSIS — N898 Other specified noninflammatory disorders of vagina: Secondary | ICD-10-CM

## 2020-01-02 LAB — CERVICOVAGINAL ANCILLARY ONLY
Bacterial Vaginitis (gardnerella): POSITIVE — AB
Candida Glabrata: NEGATIVE
Candida Vaginitis: NEGATIVE
Chlamydia: NEGATIVE
Comment: NEGATIVE
Comment: NEGATIVE
Comment: NEGATIVE
Comment: NEGATIVE
Comment: NEGATIVE
Comment: NORMAL
Neisseria Gonorrhea: NEGATIVE
Trichomonas: POSITIVE — AB

## 2020-01-02 MED ORDER — METRONIDAZOLE 500 MG PO TABS: 500.0000 mg | ORAL_TABLET | Freq: Two times a day (BID) | ORAL | 0 refills | Status: DC

## 2020-01-02 NOTE — Telephone Encounter (Addendum)
Test for Trichomonas was positive.  Rx for Flagyl 500mg  BID x 7 days was sent to pharmacy of choice.  Please refrain from sexual intercourse for 7 days after treatment to give the medicine time to work. Sexual partners need to be notified and tested/treated. Condoms may reduce risk of reinfection. Recheck or followup with PCP for further evaluation if symptoms are not improving.  BV also positive and treated with Flagyl as above.    Rx sent to [facility] but pt requests [facility] in [place].  Pharmacy contacted to cancel. Resending to [facility].

## 2020-01-02 NOTE — Addendum Note (Signed)
Addended by: Rosalee Kaufman on: 01/02/2020 06:00 PM   Modules accepted: Orders

## 2020-04-01 ENCOUNTER — Other Ambulatory Visit: Payer: Self-pay

## 2020-04-01 ENCOUNTER — Other Ambulatory Visit: Payer: Managed Care, Other (non HMO)

## 2020-04-01 DIAGNOSIS — Z20822 Contact with and (suspected) exposure to covid-19: Secondary | ICD-10-CM

## 2020-04-02 ENCOUNTER — Other Ambulatory Visit (HOSPITAL_COMMUNITY)
Admission: RE | Admit: 2020-04-02 | Discharge: 2020-04-02 | Disposition: A | Discharge status: Home / Self Care | Payer: Managed Care, Other (non HMO) | Source: Ambulatory Visit | Attending: Obstetrics & Gynecology | Admitting: Obstetrics & Gynecology

## 2020-04-02 ENCOUNTER — Other Ambulatory Visit (INDEPENDENT_AMBULATORY_CARE_PROVIDER_SITE_OTHER): Payer: Managed Care, Other (non HMO) | Admitting: *Deleted

## 2020-04-02 DIAGNOSIS — Z113 Encounter for screening for infections with a predominantly sexual mode of transmission: Secondary | ICD-10-CM

## 2020-04-02 LAB — SARS-COV-2, NAA 2 DAY TAT

## 2020-04-02 LAB — NOVEL CORONAVIRUS, NAA: SARS-CoV-2, NAA: NOT DETECTED

## 2020-04-02 NOTE — Progress Notes (Signed)
° °  NURSE VISIT- VAGINITIS/STD/POC  SUBJECTIVE:  [name] is a 35 y.o. [id number] GYN patientfemale here for a vaginal swab for STD screen.  She reports the following symptoms: vaginal discharge. for 4 days. . Denies abnormal vaginal bleeding, significant pelvic pain, fever, or UTI symptoms.  OBJECTIVE:  There were no vitals taken for this visit.  Appears well, in no apparent distress  ASSESSMENT: Vaginal swab for STD screen  PLAN: Self-collected vaginal probe for Gonorrhea, Chlamydia, Trichomonas, Bacterial Vaginosis, Yeast sent to lab Treatment: to be determined once results are received Follow-up as needed if symptoms persist/worsen, or new symptoms develop  Nance Pear  04/02/2020 2:44 PM

## 2020-04-02 NOTE — Progress Notes (Signed)
Chart reviewed for nurse visit. Agree with plan of care.  Adline Potter, NP 04/02/2020 5:03 PM

## 2020-04-04 LAB — CERVICOVAGINAL ANCILLARY ONLY
Chlamydia: NEGATIVE
Comment: NEGATIVE
Comment: NEGATIVE
Comment: NORMAL
Neisseria Gonorrhea: NEGATIVE
Trichomonas: POSITIVE — AB

## 2020-04-05 ENCOUNTER — Encounter: Payer: Self-pay | Admitting: Emergency Medicine

## 2020-04-05 ENCOUNTER — Other Ambulatory Visit: Payer: Self-pay

## 2020-04-05 ENCOUNTER — Ambulatory Visit
Admission: EM | Admit: 2020-04-05 | Discharge: 2020-04-05 | Disposition: A | Discharge status: Home / Self Care | Payer: Managed Care, Other (non HMO) | Attending: Emergency Medicine | Admitting: Emergency Medicine

## 2020-04-05 MED ORDER — METRONIDAZOLE 500 MG PO TABS: 500.0000 mg | ORAL_TABLET | Freq: Two times a day (BID) | ORAL | 0 refills | Status: DC

## 2020-04-05 NOTE — ED Triage Notes (Signed)
Pt seen her test results and she is positive for trich. Needs medication sent to pharmacy.

## 2020-04-05 NOTE — ED Provider Notes (Addendum)
Medication was sent as patient does not need a visit.   Durward Parcel, FNP 04/05/20 1452    Durward Parcel, FNP 04/05/20 1454

## 2020-04-07 ENCOUNTER — Telehealth: Payer: Self-pay | Admitting: Adult Health

## 2020-04-07 ENCOUNTER — Encounter: Payer: Self-pay | Admitting: Adult Health

## 2020-04-07 DIAGNOSIS — A599 Trichomoniasis, unspecified: Secondary | ICD-10-CM

## 2020-04-07 HISTORY — DX: Trichomoniasis, unspecified: A59.9

## 2020-04-07 NOTE — Telephone Encounter (Signed)
Call can not be completed at this time.  °

## 2020-06-23 ENCOUNTER — Ambulatory Visit
Admission: EM | Admit: 2020-06-23 | Discharge: 2020-06-23 | Disposition: A | Discharge status: Home / Self Care | Payer: Managed Care, Other (non HMO) | Attending: Emergency Medicine | Admitting: Emergency Medicine

## 2020-06-23 ENCOUNTER — Other Ambulatory Visit: Payer: Self-pay

## 2020-06-23 DIAGNOSIS — J069 Acute upper respiratory infection, unspecified: Secondary | ICD-10-CM | POA: Diagnosis not present

## 2020-06-23 DIAGNOSIS — J029 Acute pharyngitis, unspecified: Secondary | ICD-10-CM | POA: Diagnosis present

## 2020-06-23 LAB — POCT RAPID STREP A (OFFICE): Rapid Strep A Screen: NEGATIVE

## 2020-06-23 MED ORDER — CETIRIZINE HCL 10 MG PO TABS: 10.0000 mg | ORAL_TABLET | Freq: Every day | ORAL | 0 refills | Status: DC

## 2020-06-23 MED ORDER — BENZONATATE 100 MG PO CAPS: 100.0000 mg | ORAL_CAPSULE | Freq: Three times a day (TID) | ORAL | 0 refills | Status: DC

## 2020-06-23 MED ORDER — FLUTICASONE PROPIONATE 50 MCG/ACT NA SUSP: 1.0000 | Freq: Every day | NASAL | 0 refills | Status: DC

## 2020-06-23 MED ORDER — DEXAMETHASONE 4 MG PO TABS: 4.0000 mg | ORAL_TABLET | Freq: Every day | ORAL | 0 refills | Status: AC

## 2020-06-23 NOTE — Discharge Instructions (Signed)
Strep test negative, will send out for culture and we will call you with results  Get plenty of rest and push fluids Tessalon Perles prescribed for cough Zyrtec prescribed for nasal congestion, runny nose, and/or sore throat Flonase prescribed for nasal congestion and runny nose Decadron was prescribed Use throat lozenges such as Cepacol, Halls or Vicks to soothe throat Use medications daily for symptom relief Use OTC medications like ibuprofen or tylenol as needed fever or pain Call or go to the ED if you have any new or worsening symptoms such as fever, worsening cough, shortness of breath, chest tightness, chest pain, turning blue, changes in mental status, etc..Marland Kitchen

## 2020-06-23 NOTE — ED Triage Notes (Signed)
Pt presents with c/o sore throat and cough that began over the weekend, family has been sick and tested negative for covid

## 2020-06-23 NOTE — ED Provider Notes (Signed)
[facility]   [id number] 06/23/20 Arrival Time: 1728  Chief Complaint  Patient presents with  . Sore Throat  . Cough     SUBJECTIVE: History from: patient.  Cathie Bonnell is a 34 y.o. female who presented to the urgent care with a complaint of sore throat, nasal congestion with yellowish nasal discharge and cough for the past 2 to 3 days.  Has family member with same symptom.  Has tried OTC medication without relief.  Denies alleviating or aggravating factors.  Denies previous symptoms in the past.   Denies fever, chills, fatigue, ear pain, sinus pain, rhinorrhea, nasal congestion, cough, SOB, wheezing, chest pain, nausea, rash, changes in bowel or bladder habits.     ROS: As per HPI.  All other pertinent ROS negative.     Past Medical History:  Diagnosis Date  . Abnormal Pap smear   . Breast nodule 08/23/2014  . Infection    HSV2  . Trichimoniasis 04/07/2020  . Vaginal Pap smear, abnormal    Past Surgical History:  Procedure Laterality Date  . NO PAST SURGERIES     No Known Allergies No current facility-administered medications on file prior to encounter.   Current Outpatient Medications on File Prior to Encounter  Medication Sig Dispense Refill  . levonorgestrel (MIRENA) 20 MCG/24HR IUD 1 each by Intrauterine route once.    . meloxicam (MOBIC) 15 MG tablet Take 1 tablet (15 mg total) by mouth daily. 30 tablet 0  . metroNIDAZOLE (FLAGYL) 500 MG tablet Take 1 tablet (500 mg total) by mouth 2 (two) times daily. 14 tablet 0   Social History   Socioeconomic History  . Marital status: Single    Spouse name: Not on file  . Number of children: Not on file  . Years of education: Not on file  . Highest education level: Not on file  Occupational History  . Not on file  Tobacco Use  . Smoking status: Current Some Day Smoker    Packs/day: 0.50    Years: 2.00    Pack years: 1.00    Types: Cigarettes    Last attempt to quit: 11/14/2015    Years since quitting:  4.6  . Smokeless tobacco: Never Used  Substance and Sexual Activity  . Alcohol use: Yes    Comment: Occasional  . Drug use: No  . Sexual activity: Yes    Birth control/protection: I.U.D.  Other Topics Concern  . Not on file  Social History Narrative  . Not on file   Social Determinants of Health   Financial Resource Strain:   . Difficulty of Paying Living Expenses: Not on file  Food Insecurity:   . Worried About Programme researcher, broadcasting/film/video in the Last Year: Not on file  . Ran Out of Food in the Last Year: Not on file  Transportation Needs:   . Lack of Transportation (Medical): Not on file  . Lack of Transportation (Non-Medical): Not on file  Physical Activity:   . Days of Exercise per Week: Not on file  . Minutes of Exercise per Session: Not on file  Stress:   . Feeling of Stress : Not on file  Social Connections:   . Frequency of Communication with Friends and Family: Not on file  . Frequency of Social Gatherings with Friends and Family: Not on file  . Attends Religious Services: Not on file  . Active Member of Clubs or Organizations: Not on file  . Attends Banker Meetings: Not on  file  . Marital Status: Not on file  Intimate Partner Violence:   . Fear of Current or Ex-Partner: Not on file  . Emotionally Abused: Not on file  . Physically Abused: Not on file  . Sexually Abused: Not on file   Family History  Problem Relation Age of Onset  . Cancer Paternal Grandmother        breast  . Hypertension Mother   . Heart disease Mother   . Hypertension Sister   . Diabetes Maternal Grandmother   . Kidney disease Maternal Grandmother        kidney failure  . Cancer Paternal Grandfather        lung  . Cancer Paternal Aunt        breast    OBJECTIVE:  Vitals:   06/23/20 1739  BP: 110/75  Pulse: (!) 111  Resp: 18  Temp: 98.5 F (36.9 C)  SpO2: 97%     General appearance: alert; appears fatigued, but nontoxic, speaking in full sentences and managing own  secretions HEENT: NCAT; Ears: EACs clear, TMs pearly gray with visible cone of light, without erythema; Eyes: PERRL, EOMI grossly; Nose: no obvious rhinorrhea; Throat: oropharynx clear, tonsils 1+ and mildly erythematous without white tonsillar exudates, uvula midline Neck: supple without LAD Lungs: CTA bilaterally without adventitious breath sounds; cough present Heart: regular rate and rhythm.  Radial pulses 2+ symmetrical bilaterally Skin: warm and dry Psychological: alert and cooperative; normal mood and affect  LABS: No results found for this or any previous visit (from the past 24 hour(s)).   ASSESSMENT & PLAN:  1. URI with cough and congestion   2. Sore throat     Meds ordered this encounter  Medications  . benzonatate (TESSALON) 100 MG capsule    Sig: Take 1 capsule (100 mg total) by mouth every 8 (eight) hours.    Dispense:  30 capsule    Refill:  0  . fluticasone (FLONASE) 50 MCG/ACT nasal spray    Sig: Place 1 spray into both nostrils daily for 14 days.    Dispense:  16 g    Refill:  0  . cetirizine (ZYRTEC ALLERGY) 10 MG tablet    Sig: Take 1 tablet (10 mg total) by mouth daily.    Dispense:  30 tablet    Refill:  0  . dexamethasone (DECADRON) 4 MG tablet    Sig: Take 1 tablet (4 mg total) by mouth daily for 7 days.    Dispense:  7 tablet    Refill:  0    Discharge Instructions  Strep test negative, will send out for culture and we will call you with results  Get plenty of rest and push fluids Tessalon Perles prescribed for cough Zyrtec prescribed for nasal congestion, runny nose, and/or sore throat Flonase prescribed for nasal congestion and runny nose Decadron was prescribed Use throat lozenges such as Cepacol, Halls or Vicks to soothe throat Use medications daily for symptom relief Use OTC medications like ibuprofen or tylenol as needed fever or pain Call or go to the ED if you have any new or worsening symptoms such as fever, worsening cough, shortness  of breath, chest tightness, chest pain, turning blue, changes in mental status, etc...  Reviewed expectations re: course of current medical issues. Questions answered. Outlined signs and symptoms indicating need for more acute intervention. Patient verbalized understanding. After Visit Summary given.         Durward Parcel, FNP 06/23/20 1811

## 2020-06-26 LAB — CULTURE, GROUP A STREP (THRC)

## 2020-08-27 ENCOUNTER — Other Ambulatory Visit: Payer: Managed Care, Other (non HMO) | Admitting: Women's Health

## 2020-10-07 ENCOUNTER — Other Ambulatory Visit: Payer: Managed Care, Other (non HMO) | Admitting: Women's Health

## 2020-10-08 ENCOUNTER — Other Ambulatory Visit: Payer: Managed Care, Other (non HMO)

## 2020-10-13 ENCOUNTER — Other Ambulatory Visit: Payer: Self-pay

## 2020-10-13 ENCOUNTER — Emergency Department (HOSPITAL_COMMUNITY)
Admission: EM | Admit: 2020-10-13 | Discharge: 2020-10-13 | Disposition: A | Discharge status: Home / Self Care | Payer: Managed Care, Other (non HMO) | Attending: Emergency Medicine | Admitting: Emergency Medicine

## 2020-10-13 ENCOUNTER — Encounter (HOSPITAL_COMMUNITY): Payer: Self-pay | Admitting: *Deleted

## 2020-10-13 DIAGNOSIS — R070 Pain in throat: Secondary | ICD-10-CM | POA: Insufficient documentation

## 2020-10-13 DIAGNOSIS — R0689 Other abnormalities of breathing: Secondary | ICD-10-CM | POA: Insufficient documentation

## 2020-10-13 DIAGNOSIS — R509 Fever, unspecified: Secondary | ICD-10-CM | POA: Insufficient documentation

## 2020-10-13 DIAGNOSIS — F17210 Nicotine dependence, cigarettes, uncomplicated: Secondary | ICD-10-CM | POA: Diagnosis not present

## 2020-10-13 DIAGNOSIS — Z20822 Contact with and (suspected) exposure to covid-19: Secondary | ICD-10-CM | POA: Diagnosis not present

## 2020-10-13 DIAGNOSIS — R059 Cough, unspecified: Secondary | ICD-10-CM | POA: Diagnosis not present

## 2020-10-13 DIAGNOSIS — R0981 Nasal congestion: Secondary | ICD-10-CM | POA: Insufficient documentation

## 2020-10-13 LAB — RESP PANEL BY RT-PCR (FLU A&B, COVID) ARPGX2
Influenza A by PCR: NEGATIVE
Influenza B by PCR: NEGATIVE
SARS Coronavirus 2 by RT PCR: NEGATIVE

## 2020-10-13 NOTE — Discharge Instructions (Signed)
I suspect you are suffering from allergies or a upper respiratory infection.  Will recommend Flonase and Claritin as this will help with nasal disease decongestion.  You may also take over-the-counter pain medications like ibuprofen and/or Tylenol every 6 hours as needed please follow dosing back of bottle.  Recommend follow-up with your PCP in 1 week's time if symptoms are not fully resolved.  Come back to the emergency department if you develop chest pain, shortness of breath, severe abdominal pain, uncontrolled nausea, vomiting, diarrhea.

## 2020-10-13 NOTE — ED Provider Notes (Signed)
[facility] EMERGENCY DEPARTMENT Provider Note   CSN: [id number] Arrival date & time: 10/13/20  1332     History Chief Complaint  Patient presents with  . Nasal Congestion    [name] is a 35 y.o. female.  HPI   Patient with no significant medical history presents to the emergency department with chief complaint of nasal congestion, scratchy throat, voice change.  She endorses that the symptoms started approximate 2 days ago, she has had subjective fevers and chills with a nonproductive cough.  She denies recent sick contacts, is not immunocompromise, is up-to-date on her Covid vaccine.  She has history of allergies as well as history of strep throat.  She states this does not feel like strep throat as her throat is scratchy and actually it feels much better.  She denies any alleviating factors.  Patient denies headaches, chest pain, shortness of breath, abdominal pain, nausea, vomiting, diarrhea, general malaise, worsening pedal edema.  Past Medical History:  Diagnosis Date  . Abnormal Pap smear   . Breast nodule 08/23/2014  . Infection    HSV2  . Trichimoniasis 04/07/2020  . Vaginal Pap smear, abnormal     Patient Active Problem List   Diagnosis Date Noted  . Trichimoniasis 04/07/2020  . Breast nodule 08/23/2014  . Encounter for IUD insertion 03/13/2014  . Preterm delivery 01/20/2014  . HSV-2 seropositive 12/08/2013    Past Surgical History:  Procedure Laterality Date  . NO PAST SURGERIES       OB History    Gravida  2   Para  2   Term  1   Preterm  1   AB      Living  2     SAB      IAB      Ectopic      Multiple      Live Births  2           Family History  Problem Relation Age of Onset  . Cancer Paternal Grandmother        breast  . Hypertension Mother   . Heart disease Mother   . Hypertension Sister   . Diabetes Maternal Grandmother   . Kidney disease Maternal Grandmother        kidney failure  . Cancer Paternal Grandfather         lung  . Cancer Paternal Aunt        breast    Social History   Tobacco Use  . Smoking status: Current Some Day Smoker    Packs/day: 0.50    Years: 2.00    Pack years: 1.00    Types: Cigarettes    Last attempt to quit: 11/14/2015    Years since quitting: 4.9  . Smokeless tobacco: Never Used  Substance Use Topics  . Alcohol use: Yes    Comment: Occasional  . Drug use: No    Home Medications Prior to Admission medications   Medication Sig Start Date End Date Taking? Authorizing Provider  benzonatate (TESSALON) 100 MG capsule Take 1 capsule (100 mg total) by mouth every 8 (eight) hours. 06/23/20   Avegno, Zachery Dakins, FNP  cetirizine (ZYRTEC ALLERGY) 10 MG tablet Take 1 tablet (10 mg total) by mouth daily. 06/23/20   Avegno, Zachery Dakins, FNP  fluticasone (FLONASE) 50 MCG/ACT nasal spray Place 1 spray into both nostrils daily for 14 days. 06/23/20 07/07/20  Avegno, Zachery Dakins, FNP  levonorgestrel (MIRENA) 20 MCG/24HR IUD 1 each by Intrauterine route  once.    [provider]  meloxicam (MOBIC) 15 MG tablet Take 1 tablet (15 mg total) by mouth daily. 01/01/20   Wurst, [place], PA-C  metroNIDAZOLE (FLAGYL) 500 MG tablet Take 1 tablet (500 mg total) by mouth 2 (two) times daily. 04/05/20   Durward Parcel, FNP    Allergies    Patient has no known allergies.  Review of Systems   Review of Systems  Constitutional: Negative for chills and fever.  HENT: Positive for congestion, postnasal drip and sore throat.   Eyes: Negative for visual disturbance.  Respiratory: Positive for cough. Negative for shortness of breath.   Cardiovascular: Negative for chest pain.  Gastrointestinal: Negative for abdominal pain, diarrhea, nausea and vomiting.  Genitourinary: Negative for dysuria and enuresis.  Musculoskeletal: Negative for back pain and myalgias.  Skin: Negative for rash.  Neurological: Negative for dizziness and headaches.  Hematological: Does not bruise/bleed easily.     Physical Exam Updated Vital Signs BP 112/71 (BP Location: Right Arm)   Pulse 88   Temp 97.9 F (36.6 C) (Oral)   Resp 17   Ht 5\' 6"  (1.676 m)   Wt 104.3 kg   SpO2 100%   BMI 37.12 kg/m   Physical Exam Vitals and nursing note reviewed.  Constitutional:      General: She is not in acute distress.    Appearance: She is not ill-appearing.  HENT:     Head: Normocephalic and atraumatic.     Comments: No sinus pressures    Right Ear: Tympanic membrane, ear canal and external ear normal.     Left Ear: Tympanic membrane, ear canal and external ear normal.     Nose: Congestion present.     Comments: Boggy turbinates    Mouth/Throat:     Mouth: Mucous membranes are moist.     Pharynx: Oropharynx is clear. No oropharyngeal exudate or posterior oropharyngeal erythema.     Comments: Cobblestone appearing oropharynx Eyes:     Conjunctiva/sclera: Conjunctivae normal.  Cardiovascular:     Rate and Rhythm: Normal rate and regular rhythm.     Pulses: Normal pulses.     Heart sounds: No murmur heard. No friction rub. No gallop.   Pulmonary:     Effort: No respiratory distress.     Breath sounds: No wheezing, rhonchi or rales.  Abdominal:     Palpations: Abdomen is soft.     Tenderness: There is no abdominal tenderness.  Musculoskeletal:     Right lower leg: No edema.     Left lower leg: No edema.  Skin:    General: Skin is warm and dry.  Neurological:     Mental Status: She is alert.  Psychiatric:        Mood and Affect: Mood normal.     ED Results / Procedures / Treatments   Labs (all labs ordered are listed, but only abnormal results are displayed) Labs Reviewed  RESP PANEL BY RT-PCR (FLU A&B, COVID) [id number]    EKG None  Radiology No results found.  Procedures Procedures   Medications Ordered in ED Medications - No data to display  ED Course  I have reviewed the triage vital signs and the nursing notes.  Pertinent labs & imaging results that were  available during my care of the patient were reviewed by me and considered in my medical decision making (see chart for details).    MDM Rules/Calculators/A&P  Initial impression-patient presents with nasal congestion, scratchy throat, nonproductive cough.  She is alert, does not appear in acute distress, vital signs reassuring.  Work-up-Covid test pending at this time.  Rule out- Low suspicion for systemic infection as patient is nontoxic-appearing, vital signs reassuring, no obvious source infection noted on exam.  Low suspicion for pneumonia as lung sounds are clear bilaterally, will defer imaging at this time.  Low suspicion for sinusitis as patient has no sinus pressure on exam, atypical for to develop in 3 days.  I have low suspicion for PE as patient denies pleuritic chest pain, shortness of breath, patient is PERC. low suspicion for strep throat as oropharynx was visualized, no erythema or exudates noted.  Low suspicion patient would need  hospitalized due to viral infection or Covid as vital signs reassuring, patient is not in respiratory distress.  Plan-I suspect patient suffering from allergies she has boggy turbinates as well as a cobblestone appearing oropharynx.  Possible she may be suffering from a viral URI but I doubt this at this time.  Will provide patient with Flonase and Claritin and have her follow-up with PCP in 1 week's time for reevaluation.  Vital signs have remained stable, no indication for hospital admission. Patient given at home care as well strict return precautions.  Patient verbalized that they understood agreed to said plan.   Final Clinical Impression(s) / ED Diagnoses Final diagnoses:  Nasal congestion  Cough    Rx / DC Orders ED Discharge Orders    None       Carroll Sage, PA-C 10/13/20 1527    Terald Sleeper, MD 10/13/20 [phone]

## 2020-10-13 NOTE — ED Triage Notes (Signed)
Nasal congestion, hoarseness and sneezing for 2 days, history of allergies

## 2020-11-05 ENCOUNTER — Other Ambulatory Visit: Payer: Self-pay

## 2020-11-05 ENCOUNTER — Other Ambulatory Visit (HOSPITAL_COMMUNITY)
Admission: RE | Admit: 2020-11-05 | Discharge: 2020-11-05 | Disposition: A | Discharge status: Home / Self Care | Payer: Managed Care, Other (non HMO) | Source: Ambulatory Visit | Attending: Advanced Practice Midwife | Admitting: Advanced Practice Midwife

## 2020-11-05 ENCOUNTER — Encounter: Payer: Self-pay | Admitting: Advanced Practice Midwife

## 2020-11-05 ENCOUNTER — Ambulatory Visit (INDEPENDENT_AMBULATORY_CARE_PROVIDER_SITE_OTHER): Payer: Managed Care, Other (non HMO) | Admitting: Advanced Practice Midwife

## 2020-11-05 VITALS — BP 112/74 | HR 83 | Ht 66.0 in | Wt 227.0 lb

## 2020-11-05 DIAGNOSIS — Z01419 Encounter for gynecological examination (general) (routine) without abnormal findings: Secondary | ICD-10-CM

## 2020-11-05 DIAGNOSIS — Z30430 Encounter for insertion of intrauterine contraceptive device: Secondary | ICD-10-CM

## 2020-11-05 NOTE — Progress Notes (Signed)
WELL-WOMAN EXAMINATION Patient name: [name] MRN [id number]  Date of birth: [birth year] Chief Complaint:   Gynecologic Exam (Pap/physical/ want iud string check / last pap 06-29-17 normal -hpv)  History of Present Illness:   [name] is a 35 y.o. G12P1102 African American female being seen today for a routine well-woman exam.  Current complaints: wants to make sure IUD is in place; has occ cycles, light; no complaints  Depression screen [facility] 2/9 11/05/2020 06/29/2017  Decreased Interest 1 1  Down, Depressed, Hopeless 1 1  PHQ - 2 Score 2 2  Altered sleeping 1 1  Tired, decreased energy 0 1  Change in appetite 0 0  Feeling bad or failure about yourself  0 0  Trouble concentrating 0 0  Moving slowly or fidgety/restless 0 0  Suicidal thoughts 0 0  PHQ-9 Score 3 4     PCP: still looking (rec Sam Jean Rosenthal PA-C @ [place])      does not desire labs Patient's last menstrual period was 10/27/2020. The current method of family planning is IUD.  Last pap Nov 2018. Results were: NILM w/ HRHPV negative. H/O abnormal pap: no Last mammogram: <40yo. Family h/o breast cancer: no (not dx prior to age 26s) Last colonoscopy: never. Family h/o colorectal cancer: no Review of Systems:   Pertinent items are noted in HPI Denies any headaches, blurred vision, fatigue, shortness of breath, chest pain, abdominal pain, abnormal vaginal discharge/itching/odor/irritation, problems with periods, bowel movements, urination, or intercourse unless otherwise stated above. Pertinent History Reviewed:  Reviewed past medical,surgical, social and family history.  Reviewed problem list, medications and allergies. Physical Assessment:   Vitals:   11/05/20 1434  BP: 112/74  Pulse: 83  Weight: 227 lb (103 kg)  Height: 5\' 6"  (1.676 m)  Body mass index is 36.64 kg/m.        Physical Examination:   General appearance - well appearing, and in no distress  Mental status - alert, oriented to person, place, and  time  Psych:  She has a normal mood and affect  Skin - warm and dry, normal color, no suspicious lesions noted  Chest - effort normal, all lung fields clear to auscultation bilaterally  Heart - normal rate and regular rhythm  Neck:  midline trachea, no thyromegaly or nodules  Breasts - breasts appear normal, no suspicious masses, no skin or nipple changes or  axillary nodes  Abdomen - soft, nontender, nondistended, no masses or organomegaly  Pelvic - VULVA: normal appearing vulva with no masses, tenderness or lesions  VAGINA: normal appearing vagina with normal color and discharge, no lesions  CERVIX: normal appearing cervix without discharge or lesions, no CMT; IUD strings visible  Thin prep pap is done with HR HPV cotesting  UTERUS: uterus is felt to be normal size, shape, consistency and nontender   ADNEXA: No adnexal masses or tenderness noted.  Extremities:  No swelling or varicosities noted  Chaperone:    No results found for this or any previous visit (from the past 24 hour(s)).  Assessment & Plan:  1) Well-Woman Exam  2) IUD check, strings in place  Labs/procedures today: Pap w HPV, GC/chlam  Mammogram: @ 34yo, or sooner if problems Colonoscopy: @ 34yo, or sooner if problems  No orders of the defined types were placed in this encounter.   Meds: No orders of the defined types were placed in this encounter.   Follow-up: Return in about 1 year (around 11/05/2021) for Physical.  11/07/2021  Clelia Croft CNM 11/05/2020 3:00 PM

## 2020-11-11 LAB — CYTOLOGY - PAP
Chlamydia: NEGATIVE
Comment: NEGATIVE
Comment: NEGATIVE
Comment: NEGATIVE
Comment: NORMAL
Diagnosis: UNDETERMINED — AB
HPV 16: NEGATIVE
HPV 18 / 45: NEGATIVE
High risk HPV: POSITIVE — AB
Neisseria Gonorrhea: NEGATIVE

## 2020-12-09 ENCOUNTER — Encounter (HOSPITAL_COMMUNITY): Payer: Self-pay

## 2020-12-09 ENCOUNTER — Emergency Department (HOSPITAL_COMMUNITY)
Admission: EM | Admit: 2020-12-09 | Discharge: 2020-12-09 | Disposition: A | Discharge status: Home / Self Care | Payer: Managed Care, Other (non HMO) | Attending: Emergency Medicine | Admitting: Emergency Medicine

## 2020-12-09 ENCOUNTER — Other Ambulatory Visit: Payer: Self-pay

## 2020-12-09 DIAGNOSIS — L819 Disorder of pigmentation, unspecified: Secondary | ICD-10-CM | POA: Insufficient documentation

## 2020-12-09 DIAGNOSIS — Z87891 Personal history of nicotine dependence: Secondary | ICD-10-CM | POA: Diagnosis not present

## 2020-12-09 MED ORDER — LIDOCAINE-EPINEPHRINE 1 %-1:100000 IJ SOLN
10.0000 mL | Freq: Once | INTRAMUSCULAR | Status: AC
  Administered 2020-12-09: 10 mL
  Filled 2020-12-09: qty 1

## 2020-12-09 NOTE — ED Triage Notes (Signed)
Pt came in POV d/t a bump on her Right leg, below her knee that has "popped" & now will not stop bleeding. Pt denies leg pain or any pus drainage coming from the area.

## 2020-12-09 NOTE — ED Provider Notes (Signed)
[facility] EMERGENCY DEPARTMENT Provider Note   CSN: [id number] Arrival date & time: 12/09/20  0820     History Chief Complaint  Patient presents with  . Bump on leg wont stop bleeding    [name] is a 35 y.o. female.  35 year old female with past medical history below who presents with bleeding from her right leg.  Patient has had a bump on her right knee for quite a while and it occasionally bleeds when she shaves or bumps it.  This morning it began bleeding and she has not been able to get it to stop.  She has never had it evaluated before.  She denies any associated pain or puslike drainage.  No anticoagulant use.  The history is provided by the patient.       Past Medical History:  Diagnosis Date  . Abnormal Pap smear   . Breast nodule 08/23/2014  . Infection    HSV2  . Trichimoniasis 04/07/2020  . Vaginal Pap smear, abnormal     Patient Active Problem List   Diagnosis Date Noted  . Trichimoniasis 04/07/2020  . Encounter for IUD insertion 03/13/2014  . HSV-2 seropositive 12/08/2013    Past Surgical History:  Procedure Laterality Date  . NO PAST SURGERIES       OB History    Gravida  2   Para  2   Term  1   Preterm  1   AB      Living  2     SAB      IAB      Ectopic      Multiple      Live Births  2           Family History  Problem Relation Age of Onset  . Cancer Paternal Grandmother        breast  . Hypertension Mother   . Heart disease Mother   . Hypertension Sister   . Diabetes Maternal Grandmother   . Kidney disease Maternal Grandmother        kidney failure  . Cancer Paternal Grandfather        lung  . Cancer Paternal Aunt        breast    Social History   Tobacco Use  . Smoking status: Former Smoker    Packs/day: 0.50    Years: 2.00    Pack years: 1.00    Types: Cigarettes    Quit date: 11/14/2018    Years since quitting: 2.0  . Smokeless tobacco: Never Used  Vaping Use  . Vaping Use:  Never used  Substance Use Topics  . Alcohol use: Not Currently    Comment: Occasional  . Drug use: No    Home Medications Prior to Admission medications   Medication Sig Start Date End Date Taking? Authorizing Provider  benzonatate (TESSALON) 100 MG capsule Take 1 capsule (100 mg total) by mouth every 8 (eight) hours. Patient not taking: Reported on 11/05/2020 06/23/20   Durward Parcel, FNP  cetirizine (ZYRTEC ALLERGY) 10 MG tablet Take 1 tablet (10 mg total) by mouth daily. Patient not taking: Reported on 11/05/2020 06/23/20   Durward Parcel, FNP  fluticasone (FLONASE) 50 MCG/ACT nasal spray Place 1 spray into both nostrils daily for 14 days. 06/23/20 07/07/20  Durward Parcel, FNP  levonorgestrel (MIRENA) 20 MCG/24HR IUD 1 each by Intrauterine route once.    [provider]  meloxicam (MOBIC) 15 MG tablet Take 1 tablet (  15 mg total) by mouth daily. Patient not taking: Reported on 11/05/2020 01/01/20   Wurst, [place], PA-C  metroNIDAZOLE (FLAGYL) 500 MG tablet Take 1 tablet (500 mg total) by mouth 2 (two) times daily. Patient not taking: Reported on 11/05/2020 04/05/20   Durward Parcel, FNP    Allergies    Patient has no known allergies.  Review of Systems   Review of Systems  Musculoskeletal: Negative for joint swelling.  Skin: Positive for wound.  Hematological: Does not bruise/bleed easily.    Physical Exam Updated Vital Signs BP (!) 108/57   Pulse (!) 106   Temp 98.5 F (36.9 C) (Oral)   Resp 16   SpO2 99%   Physical Exam Vitals and nursing note reviewed.  Constitutional:      General: She is not in acute distress.    Appearance: She is well-developed.  HENT:     Head: Normocephalic and atraumatic.  Eyes:     Conjunctiva/sclera: Conjunctivae normal.  Musculoskeletal:     Cervical back: Neck supple.  Skin:    General: Skin is warm and dry.     Comments: Small circular raised skin lesion on R knee just below patella with central bleeding   Neurological:     Mental Status: She is alert and oriented to person, place, and time.  Psychiatric:        Judgment: Judgment normal.     ED Results / Procedures / Treatments   Labs (all labs ordered are listed, but only abnormal results are displayed) Labs Reviewed - No data to display  EKG None  Radiology No results found.  Procedures Procedures   PROCEDURE: bleeding wound management CONSENT: verbal -discussed risks of pain, scarring, infection AREA: R knee skin lesion TECHNIQUE: injected with lidocaine with 1% epi; applied silver nitrate Patient tolerated procedure well with no complications Hemostasis achieved Medications Ordered in ED Medications  lidocaine-EPINEPHrine (XYLOCAINE W/EPI) 1 %-1:100000 (with pres) injection 10 mL (10 mLs Other Given by Other 12/09/20 3893)    ED Course  I have reviewed the triage vital signs and the nursing notes.     MDM Rules/Calculators/A&P                          Skin lesion appears consistent with simple wart.  Tiny superficial bleeding from wound was managed with lidocaine with epi injected and silver nitrate as patient states she plans on having the area removed and does not care about scarring.  She plans to follow-up with a dermatologist for definitive removal. Final Clinical Impression(s) / ED Diagnoses Final diagnoses:  Bleeding pigmented skin lesion    Rx / DC Orders ED Discharge Orders    None       Jenica Costilow, Ambrose Finland, MD 12/09/20 1714

## 2020-12-09 NOTE — ED Notes (Signed)
Pt d/c home per MD order. Discharge summary reviewed with pt, pt verbalizes understanding. No s/s of acute distress noted at discharge. Ambulatory.  

## 2021-03-04 ENCOUNTER — Other Ambulatory Visit: Payer: Self-pay

## 2021-03-04 ENCOUNTER — Ambulatory Visit (INDEPENDENT_AMBULATORY_CARE_PROVIDER_SITE_OTHER)
Admission: EM | Admit: 2021-03-04 | Discharge: 2021-03-04 | Disposition: A | Discharge status: Home / Self Care | Payer: Managed Care, Other (non HMO) | Source: Home / Self Care | Attending: Family Medicine | Admitting: Family Medicine

## 2021-03-04 ENCOUNTER — Encounter (HOSPITAL_COMMUNITY): Payer: Self-pay | Admitting: Emergency Medicine

## 2021-03-04 ENCOUNTER — Emergency Department (HOSPITAL_COMMUNITY)
Admission: EM | Admit: 2021-03-04 | Discharge: 2021-03-04 | Disposition: A | Discharge status: Home / Self Care | Payer: Managed Care, Other (non HMO) | Attending: Emergency Medicine | Admitting: Emergency Medicine

## 2021-03-04 DIAGNOSIS — Z5321 Procedure and treatment not carried out due to patient leaving prior to being seen by health care provider: Secondary | ICD-10-CM | POA: Diagnosis not present

## 2021-03-04 DIAGNOSIS — J029 Acute pharyngitis, unspecified: Secondary | ICD-10-CM | POA: Insufficient documentation

## 2021-03-04 LAB — POCT RAPID STREP A (OFFICE): Rapid Strep A Screen: NEGATIVE

## 2021-03-04 NOTE — ED Triage Notes (Signed)
Pt c/o sore throat and chills since last night. Pt states she wants to be covid tested. Pt also c/o nausea.

## 2021-03-04 NOTE — Discharge Instructions (Signed)
You may use over the counter ibuprofen or acetaminophen as needed.  For a sore throat, over the counter products such as [employer] Peroxyl Mouth Sore Rinse or Chloraseptic Sore Throat Spray may provide some temporary relief. Your rapid strep test was negative today. We have sent your throat swab for culture and will let you know of any positive results.  You have been tested for COVID-19 today. If your test returns positive, you will receive a phone call from [place] regarding your results. Negative test results are not called. Both positive and negative results area always visible on MyChart. If you do not have a MyChart account, sign up instructions are provided in your discharge papers. Please do not hesitate to contact us should you have questions or concerns.  

## 2021-03-04 NOTE — ED Provider Notes (Signed)
[facility] CARE CENTER   [id number] 03/04/21 Arrival Time: 1004  ASSESSMENT & PLAN:  1. Sore throat     No signs of peritonsillar abscess. Discussed.    Discharge Instructions      You may use over the counter ibuprofen or acetaminophen as needed.  For a sore throat, over the counter products such as [employer] Peroxyl Mouth Sore Rinse or Chloraseptic Sore Throat Spray may provide some temporary relief. Your rapid strep test was negative today. We have sent your throat swab for culture and will let you know of any positive results.  You have been tested for COVID-19 today. If your test returns positive, you will receive a phone call from [facility] regarding your results. Negative test results are not called. Both positive and negative results area always visible on MyChart. If you do not have a MyChart account, sign up instructions are provided in your discharge papers. Please do not hesitate to contact us should you have questions or concerns.       Results for orders placed or performed during the hospital encounter of 03/04/21  POCT rapid strep A  Result Value Ref Range   Rapid Strep A Screen Negative Negative   Labs Reviewed  NOVEL CORONAVIRUS, NAA  CULTURE, GROUP A STREP [facility])  POCT RAPID STREP A (OFFICE)   OTC analgesics and throat care as needed  Reviewed expectations re: course of current medical issues. Questions answered. Outlined signs and symptoms indicating need for more acute intervention. Patient verbalized understanding. After Visit Summary given.   SUBJECTIVE:  [name] is a 35 y.o. female who reports a sore throat. Describes as painful swallowing. Onset abrupt beginning yesterday. Symptoms have progressed to a point and plateaued since beginning; without voice changes. No respiratory symptoms. Normal PO intake but reports discomfort with swallowing. No specific alleviating factors. Fever: absent. No neck pain or swelling. No associated nausea,  vomiting, or abdominal pain. Known sick contacts: none. Recent travel: none. OTC treatment: none.   OBJECTIVE:  Vitals:   03/04/21 1010  BP: 104/68  Pulse: 93  Resp: 16  Temp: (!) 96.9 F (36.1 C)  TempSrc: Temporal  SpO2: 98%    General appearance: alert; no distress HEENT: throat with moderate erythema; no exudates;  uvula is midline Neck: supple with FROM; no lymphadenopathy Lungs: speaks full sentences without difficulty; unlabored Abd: soft; non-tender Skin: reveals no rash; warm and dry Psychological: alert and cooperative; normal mood and affect  No Known Allergies  Past Medical History:  Diagnosis Date   Abnormal Pap smear    Breast nodule 08/23/2014   Infection    HSV2   Trichimoniasis 04/07/2020   Vaginal Pap smear, abnormal    Social History   Socioeconomic History   Marital status: Single    Spouse name: Not on file   Number of children: Not on file   Years of education: Not on file   Highest education level: Not on file  Occupational History   Not on file  Tobacco Use   Smoking status: Former    Packs/day: 0.50    Years: 2.00    Pack years: 1.00    Types: Cigarettes    Quit date: 11/14/2018    Years since quitting: 2.3   Smokeless tobacco: Never  Vaping Use   Vaping Use: Never used  Substance and Sexual Activity   Alcohol use: Not Currently    Comment: Occasional   Drug use: No   Sexual activity: Yes    Birth  control/protection: I.U.D.  Other Topics Concern   Not on file  Social History Narrative   Not on file   Social Determinants of Health   Financial Resource Strain: Medium Risk   Difficulty of Paying Living Expenses: Somewhat hard  Food Insecurity: Food Insecurity Present   Worried About Running Out of Food in the Last Year: Sometimes true   Ran Out of Food in the Last Year: Sometimes true  Transportation Needs: No Transportation Needs   Lack of Transportation (Medical): No   Lack of Transportation (Non-Medical): No  Physical  Activity: Sufficiently Active   Days of Exercise per Week: 5 days   Minutes of Exercise per Session: 60 min  Stress: No Stress Concern Present   Feeling of Stress : Not at all  Social Connections: Socially Isolated   Frequency of Communication with Friends and Family: More than three times a week   Frequency of Social Gatherings with Friends and Family: Twice a week   Attends Religious Services: Never   Database administrator or Organizations: No   Attends Engineer, structural: Never   Marital Status: Never married  Catering manager Violence: Not At Risk   Fear of Current or Ex-Partner: No   Emotionally Abused: No   Physically Abused: No   Sexually Abused: No   Family History  Problem Relation Age of Onset   Cancer Paternal Grandmother        breast   Hypertension Mother    Heart disease Mother    Hypertension Sister    Diabetes Maternal Grandmother    Kidney disease Maternal Grandmother        kidney failure   Cancer Paternal Grandfather        lung   Cancer Paternal Aunt        breast           Mardella Layman, MD 03/04/21 1120

## 2021-03-04 NOTE — ED Notes (Signed)
Screener reports pt left.

## 2021-03-04 NOTE — ED Triage Notes (Signed)
Patient presents to Urgent Care with complaints of sore throat, chills, since last night. Pt requesting covid test. Not taking anything for symptoms.   Denies fever.

## 2021-03-05 LAB — SARS-COV-2, NAA 2 DAY TAT

## 2021-03-05 LAB — NOVEL CORONAVIRUS, NAA: SARS-CoV-2, NAA: NOT DETECTED

## 2021-03-07 LAB — CULTURE, GROUP A STREP (THRC)

## 2022-02-02 IMAGING — DX DG SACRUM/COCCYX 2+V
3 series · 3 of 3 positions shown · non-contrast
Comparison: None.

CLINICAL DATA: Fall at work 5 days ago. Sacrococcygeal pain.
Initial encounter.

EXAM:
SACRUM AND COCCYX - 2+ VIEW

[sacrum lat]
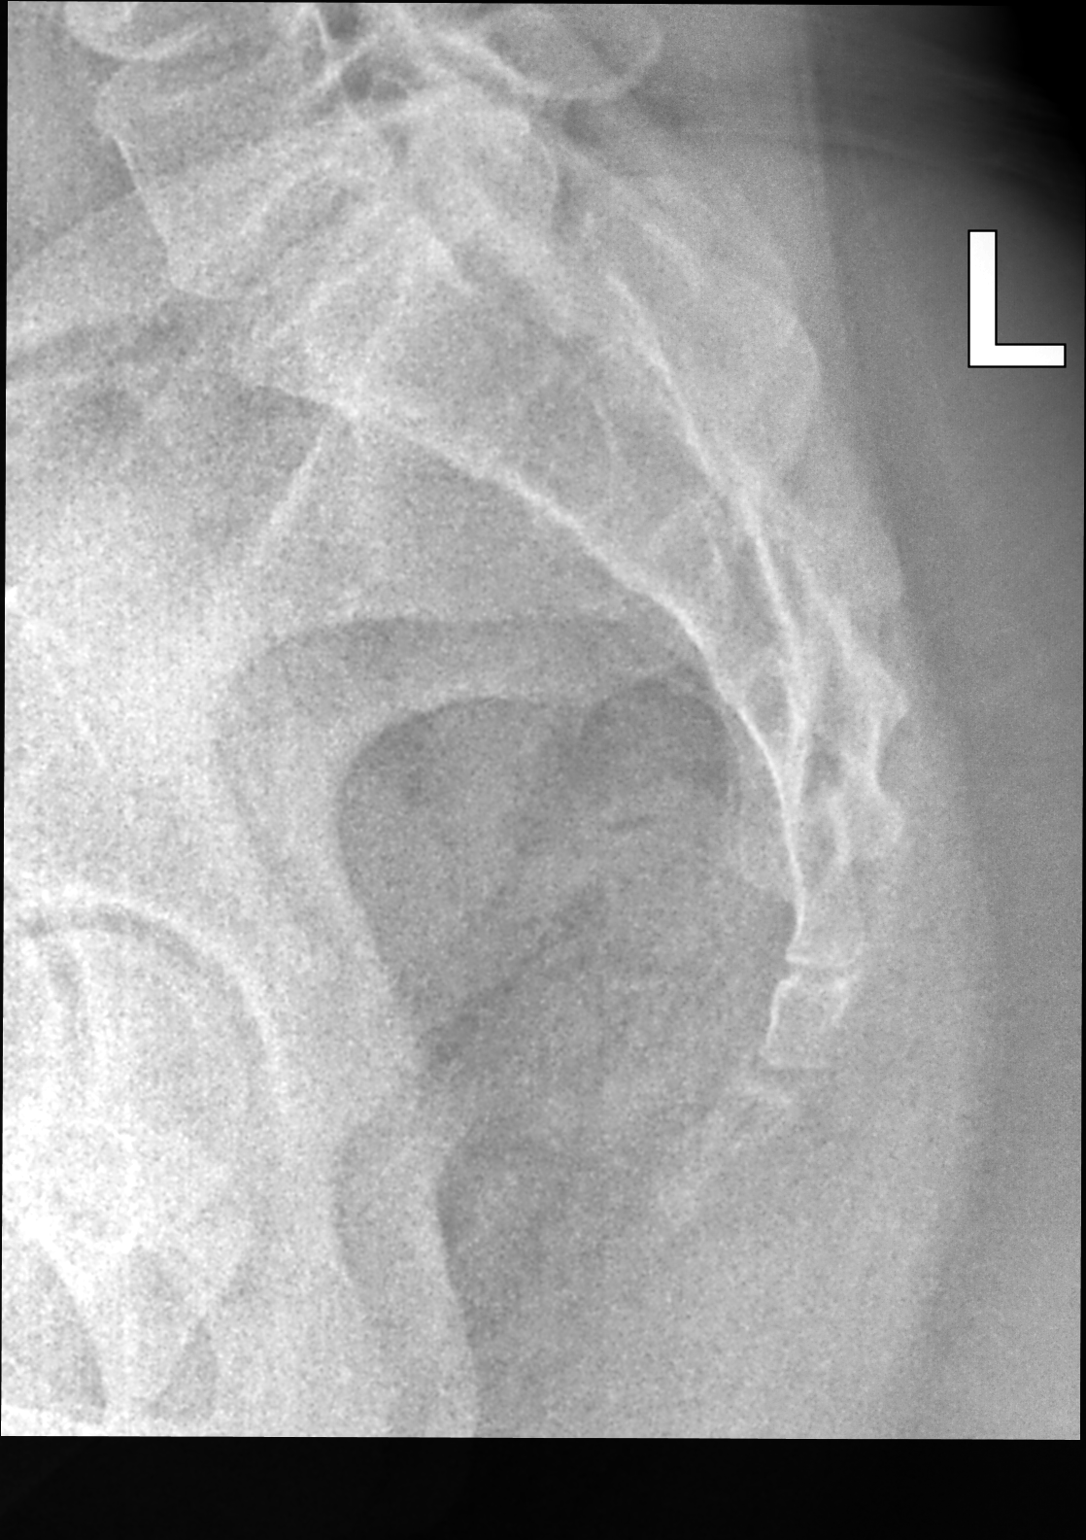

[coccyx 20° cranio-caudal ap]
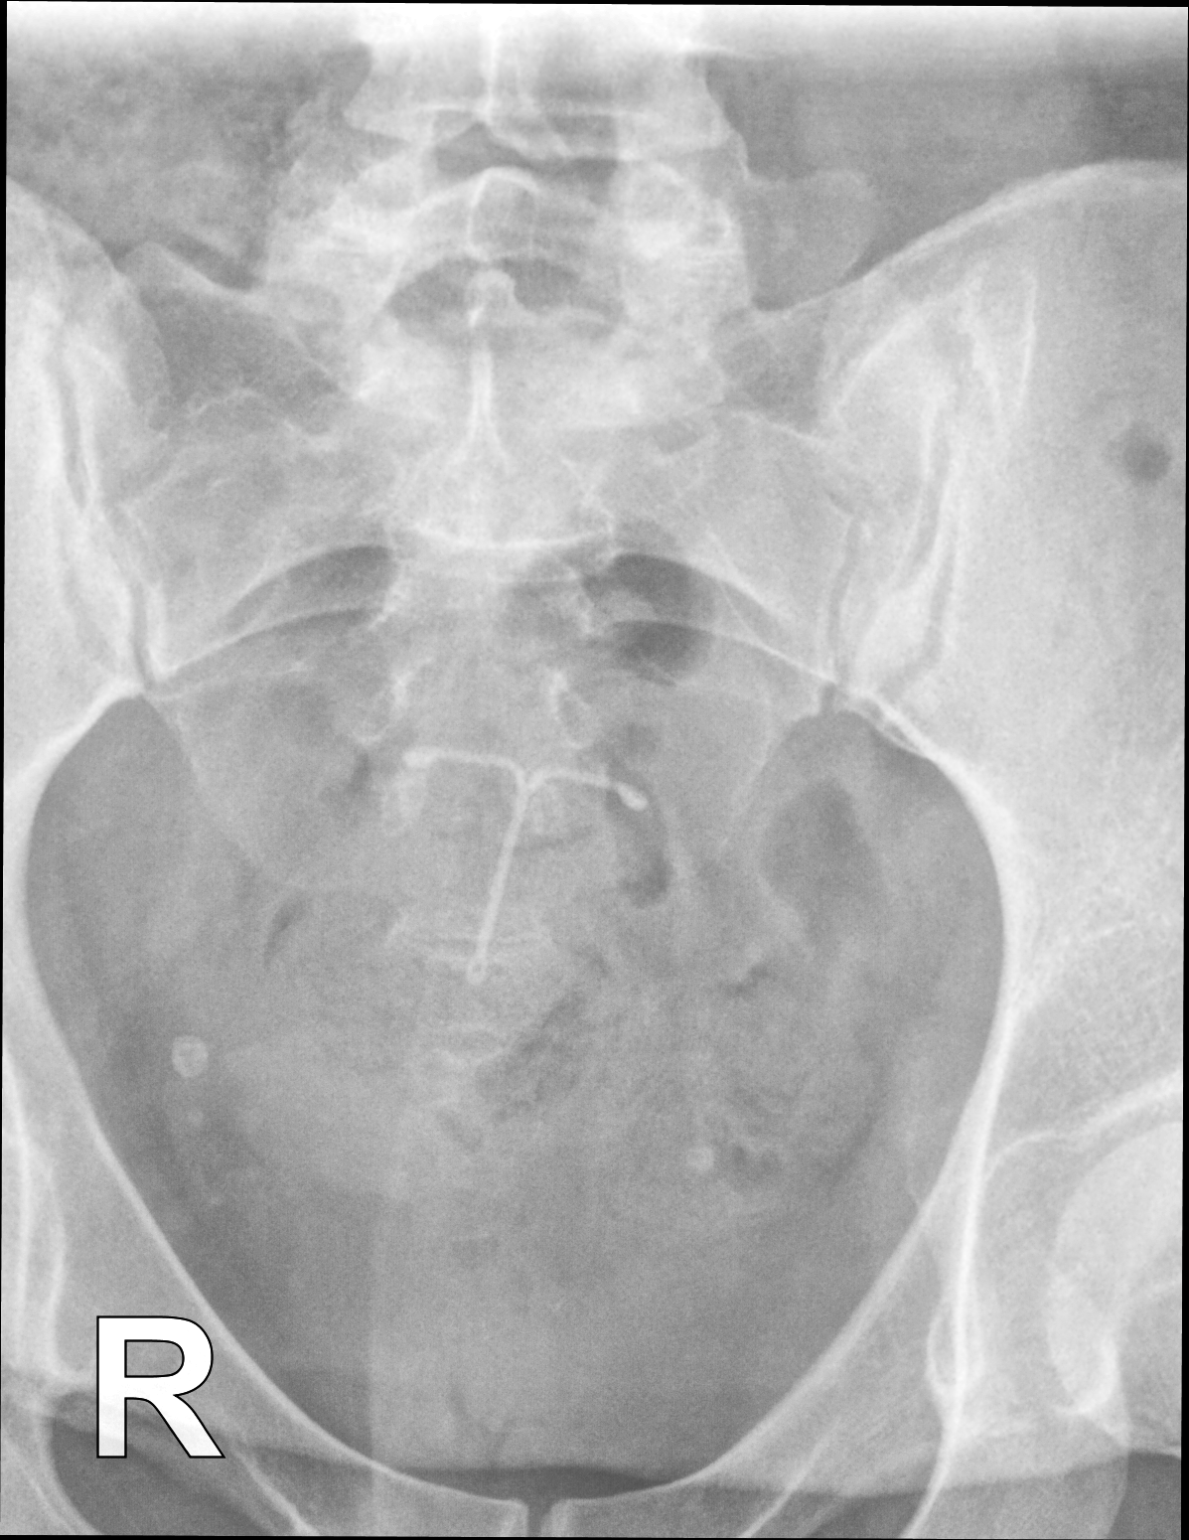

[sacrum 20° caudo-cranial ap]
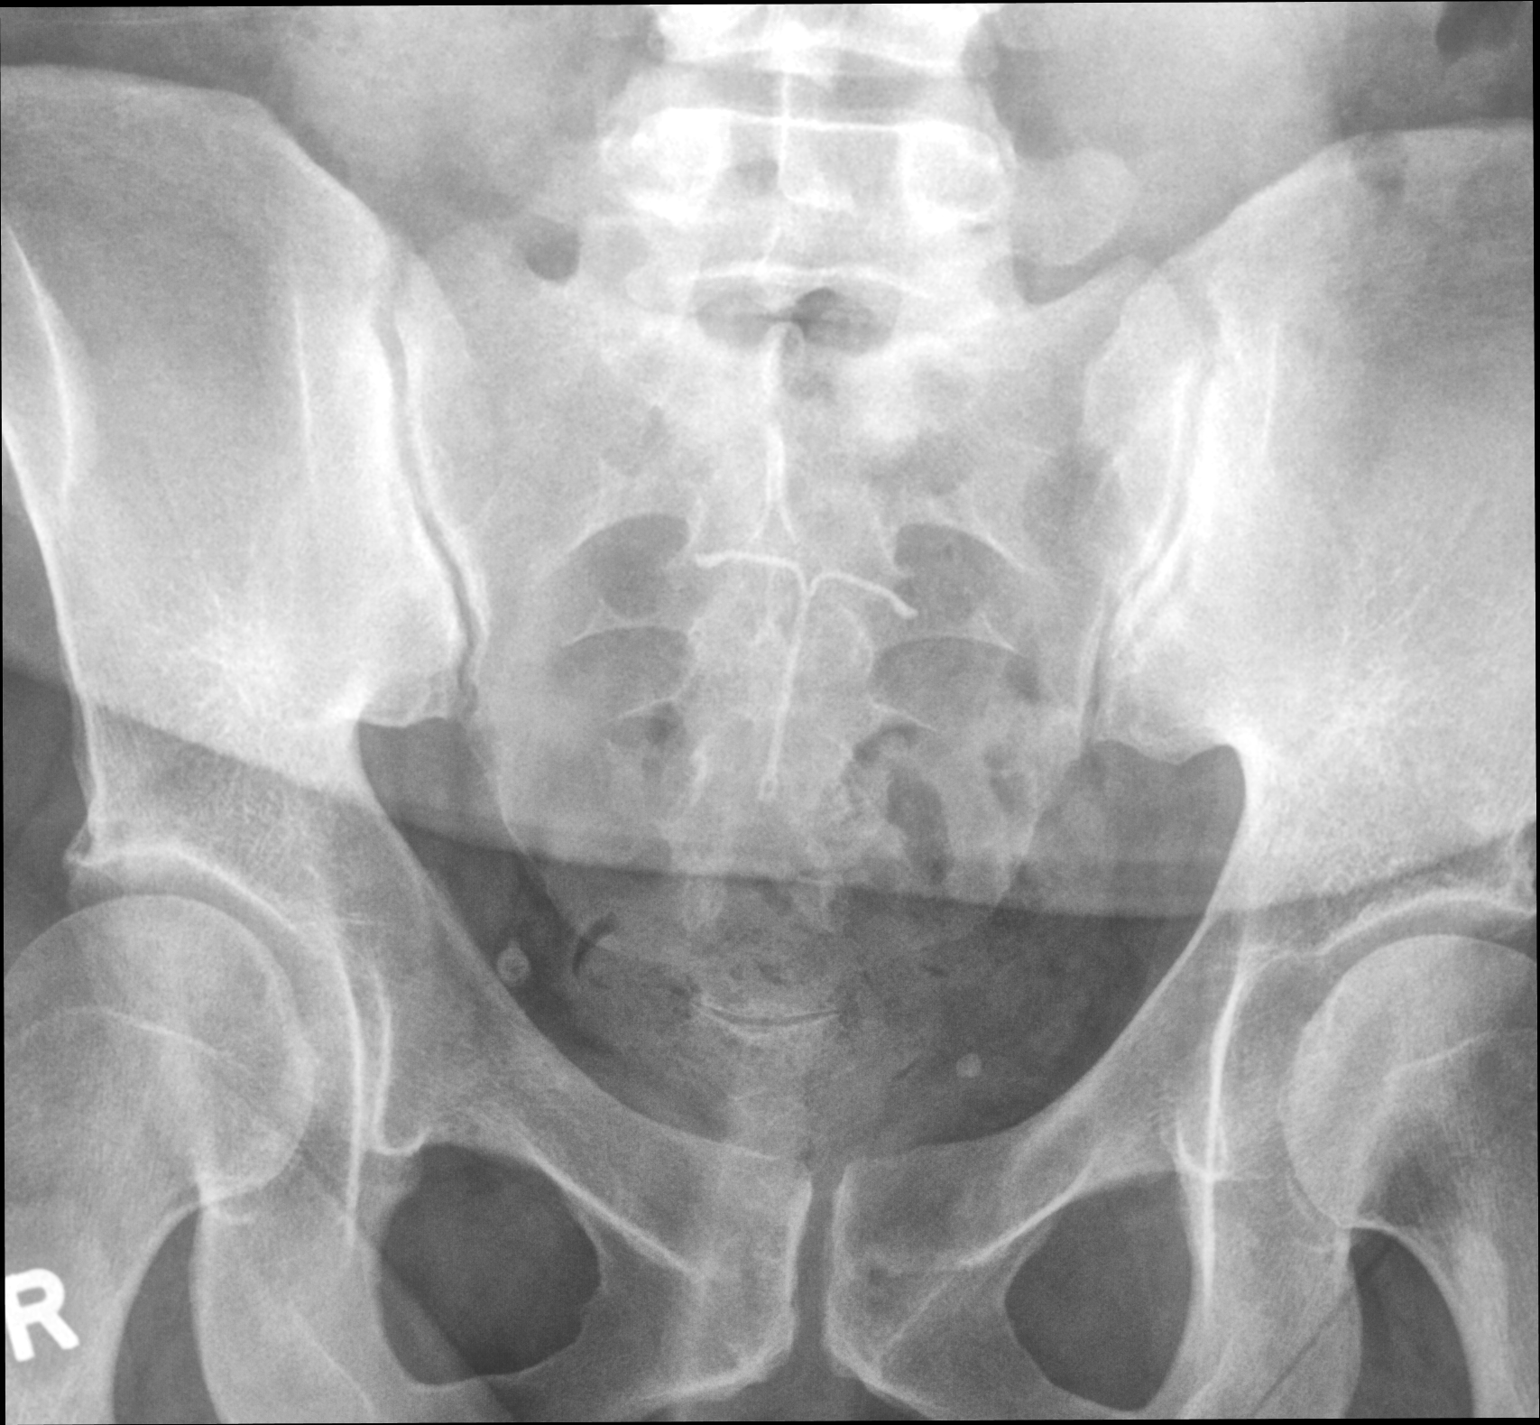

[3 of 3 positions shown; findings below may reference images not displayed]

FINDINGS: There is no evidence of fracture or other focal bone lesions. IUD
noted within the mid pelvis.
IMPRESSION: Negative.

## 2022-02-22 ENCOUNTER — Other Ambulatory Visit (HOSPITAL_COMMUNITY)
Admission: RE | Admit: 2022-02-22 | Discharge: 2022-02-22 | Disposition: A | Discharge status: Home / Self Care | Payer: Managed Care, Other (non HMO) | Source: Ambulatory Visit | Attending: Adult Health | Admitting: Adult Health

## 2022-02-22 ENCOUNTER — Ambulatory Visit (INDEPENDENT_AMBULATORY_CARE_PROVIDER_SITE_OTHER): Payer: Managed Care, Other (non HMO) | Admitting: Adult Health

## 2022-02-22 ENCOUNTER — Encounter: Payer: Self-pay | Admitting: Adult Health

## 2022-02-22 VITALS — BP 110/66 | HR 93 | Ht 65.25 in | Wt 215.0 lb

## 2022-02-22 DIAGNOSIS — Z01419 Encounter for gynecological examination (general) (routine) without abnormal findings: Secondary | ICD-10-CM | POA: Insufficient documentation

## 2022-02-22 DIAGNOSIS — T8332XA Displacement of intrauterine contraceptive device, initial encounter: Secondary | ICD-10-CM | POA: Insufficient documentation

## 2022-02-22 DIAGNOSIS — N930 Postcoital and contact bleeding: Secondary | ICD-10-CM | POA: Insufficient documentation

## 2022-02-22 DIAGNOSIS — Z8742 Personal history of other diseases of the female genital tract: Secondary | ICD-10-CM | POA: Insufficient documentation

## 2022-02-22 DIAGNOSIS — Z113 Encounter for screening for infections with a predominantly sexual mode of transmission: Secondary | ICD-10-CM | POA: Diagnosis not present

## 2022-02-22 NOTE — Progress Notes (Signed)
Patient ID: [name], female   DOB: 12/12/1985, 36 y.o.   MRN: [id number] History of Present Illness: [name] is a 36 year old black female,single, G2P1102, in for a well woman gyn exam and pap. She has had spotting with sex, and had bump on labia but it has resolved. She is requesting STD testing.  Lab Results  Component Value Date   DIAGPAP (A) 11/05/2020    - Atypical squamous cells of undetermined significance (ASC-US)   DIAGPAP  06/29/2017    NEGATIVE FOR INTRAEPITHELIAL LESIONS OR MALIGNANCY.   HPV NOT DETECTED 06/29/2017   HPVHIGH Positive (A) 11/05/2020     Current Medications, Allergies, Past Medical History, Past Surgical History, Family History and Social History were reviewed in [employer] record.     Review of Systems: Patient denies any headaches, hearing loss, fatigue, blurred vision, shortness of breath, chest pain, abdominal pain, problems with bowel movements, urination, or intercourse. No joint pain or mood swings.  See HPI for positives.  Physical Exam:BP 110/66 (BP Location: Left Arm, Patient Position: Sitting, Cuff Size: Large)   Pulse 93   Ht 5' 5.25" (1.657 m)   Wt 215 lb (97.5 kg)   LMP 02/06/2022 (Approximate)   BMI 35.50 kg/m   General:  Well developed, well nourished, no acute distress Skin:  Warm and dry Neck:  Midline trachea, normal thyroid, good ROM, no lymphadenopathy Lungs; Clear to auscultation bilaterally Breast:  No dominant palpable mass, retraction, or nipple discharge Cardiovascular: Regular rate and rhythm Abdomen:  Soft, non tender, no hepatosplenomegaly Pelvic:  External genitalia is normal in appearance, no lesions.  The vagina is normal in appearance. Urethra has no lesions or masses. The cervix is smooth, pap with GC/CHL and HR HPV genotyping performed, no IUD strings seen.   Uterus is felt to be normal size, shape, and contour.  No adnexal masses or tenderness noted.Bladder is non tender, no masses felt. Rectal:  Deferred Extremities/musculoskeletal:  No swelling or varicosities noted, no clubbing or cyanosis Psych:  No mood changes, alert and cooperative,seems happy AA is 1 Fall risk is low    02/22/2022   11:47 AM 11/05/2020    2:47 PM 06/29/2017    2:20 PM  Depression screen PHQ 2/9  Decreased Interest 0 1 1  Down, Depressed, Hopeless 2 1 1   PHQ - 2 Score 2 2 2   Altered sleeping 2 1 1   Tired, decreased energy 2 0 1  Change in appetite 0 0 0  Feeling bad or failure about yourself  0 0 0  Trouble concentrating 0 0 0  Moving slowly or fidgety/restless 0 0 0  Suicidal thoughts 0 0 0  PHQ-9 Score 6 3 4        02/22/2022   11:47 AM 11/05/2020    2:48 PM  GAD 7 : Generalized Anxiety Score  Nervous, Anxious, on Edge 0 0  Control/stop worrying 0 0  Worry too much - different things 1 1  Trouble relaxing 0 0  Restless 0 0  Easily annoyed or irritable 1 1  Afraid - awful might happen 0 0  Total GAD 7 Score 2 2      Upstream - 02/22/22 1151       Pregnancy Intention Screening   Does the patient want to become pregnant in the next year? No    Does the patient's partner want to become pregnant in the next year? No    Would the patient like to discuss contraceptive options  today? No      Contraception Wrap Up   Current Method IUD or IUS    End Method IUD or IUS            Examination chaperoned by Malachy Mood LPN  Impression and Plan: 1. Encounter for gynecological examination with Papanicolaou smear of cervix Pap sent Pap in 3 years if normal Physical in 1 year - Cytology - PAP( [place])  2. Intrauterine contraceptive device threads lost, initial encounter Mirena was placed 04/25/2019 She does not feel for strings Will get [place] 02/23/22 in office to check IUD position - US PELVIC COMPLETE WITH TRANSVAGINAL; Future Note given for work for today and appt in am, RTW 02/23/22 after appt.  3. PCB (post coital bleeding) - US PELVIC COMPLETE WITH TRANSVAGINAL; Future  4.  Screening examination for STD (sexually transmitted disease) Will check labs at her request  - Hepatitis C antibody - Hepatitis B surface antigen - HIV Antibody (routine testing w rflx) - RPR - HSV 2 antibody, IgG  5. History of abnormal cervical Pap smear Pap sent

## 2022-02-23 ENCOUNTER — Ambulatory Visit (INDEPENDENT_AMBULATORY_CARE_PROVIDER_SITE_OTHER): Payer: Managed Care, Other (non HMO)

## 2022-02-23 DIAGNOSIS — N930 Postcoital and contact bleeding: Secondary | ICD-10-CM | POA: Diagnosis not present

## 2022-02-23 DIAGNOSIS — T8332XA Displacement of intrauterine contraceptive device, initial encounter: Secondary | ICD-10-CM

## 2022-02-23 LAB — RPR: RPR Ser Ql: NONREACTIVE

## 2022-02-23 LAB — HSV 2 ANTIBODY, IGG: HSV 2 IgG, Type Spec: 9.39 index — ABNORMAL HIGH (ref 0.00–0.90)

## 2022-02-23 LAB — HEPATITIS C ANTIBODY: Hep C Virus Ab: NONREACTIVE

## 2022-02-23 LAB — HEPATITIS B SURFACE ANTIGEN: Hepatitis B Surface Ag: NEGATIVE

## 2022-02-23 LAB — HIV ANTIBODY (ROUTINE TESTING W REFLEX): HIV Screen 4th Generation wRfx: NONREACTIVE

## 2022-02-23 NOTE — Progress Notes (Signed)
PELVIC US TA/TV: homogeneous anteverted uterus,WNL,EEC 5.7 mm,IUD is centrally located within the endometrium,normal left ovary,unilocular involuting hemorrhagic right ovarian cyst 2.7 x 2.x 1.9 cm,no free fluid,no pain during ultrasound,ovaries appear Web designer

## 2022-02-25 ENCOUNTER — Other Ambulatory Visit: Payer: Self-pay | Admitting: Adult Health

## 2022-02-25 LAB — CYTOLOGY - PAP
Chlamydia: NEGATIVE
Comment: NEGATIVE
Comment: NEGATIVE
Comment: NORMAL
Diagnosis: NEGATIVE
Diagnosis: REACTIVE
High risk HPV: NEGATIVE
Neisseria Gonorrhea: NEGATIVE

## 2022-02-25 MED ORDER — METRONIDAZOLE 500 MG PO TABS: 500.0000 mg | ORAL_TABLET | Freq: Two times a day (BID) | ORAL | 0 refills | Status: DC

## 2022-02-25 NOTE — Progress Notes (Signed)
+  BV on pap will rx flagyl  

## 2022-03-08 ENCOUNTER — Other Ambulatory Visit: Payer: Self-pay | Admitting: Adult Health

## 2022-03-08 MED ORDER — FLUCONAZOLE 150 MG PO TABS: ORAL_TABLET | ORAL | 1 refills | Status: DC

## 2022-03-08 NOTE — Progress Notes (Signed)
Will rx diflucan  

## 2022-10-08 ENCOUNTER — Other Ambulatory Visit: Payer: Self-pay

## 2022-10-08 ENCOUNTER — Emergency Department (HOSPITAL_COMMUNITY)
Admission: EM | Admit: 2022-10-08 | Discharge: 2022-10-08 | Disposition: A | Discharge status: Home / Self Care | Payer: Managed Care, Other (non HMO) | Attending: Emergency Medicine | Admitting: Emergency Medicine

## 2022-10-08 ENCOUNTER — Encounter (HOSPITAL_COMMUNITY): Payer: Self-pay

## 2022-10-08 DIAGNOSIS — R059 Cough, unspecified: Secondary | ICD-10-CM | POA: Diagnosis present

## 2022-10-08 DIAGNOSIS — J069 Acute upper respiratory infection, unspecified: Secondary | ICD-10-CM

## 2022-10-08 MED ORDER — ONDANSETRON 4 MG PO TBDP: ORAL_TABLET | ORAL | 0 refills | Status: DC

## 2022-10-08 MED ORDER — BENZONATATE 100 MG PO CAPS: 100.0000 mg | ORAL_CAPSULE | Freq: Three times a day (TID) | ORAL | 0 refills | Status: DC

## 2022-10-08 NOTE — ED Provider Notes (Signed)
[place] Provider Note   CSN: [id number] Arrival date & time: 10/08/22  [id number]     History  Chief Complaint  Patient presents with   URI    [name] is a 37 y.o. female.  37 yo F with a chief complaints of cough congestion this is been going on for about 48 hours.  Her daughter is sick with something similar.  Otherwise no known sick contacts no recent travel no rash.   URI      Home Medications Prior to Admission medications   Medication Sig Start Date End Date Taking? Authorizing Provider  benzonatate (TESSALON) 100 MG capsule Take 1 capsule (100 mg total) by mouth every 8 (eight) hours. 10/08/22  Yes Deno Etienne, DO  ondansetron (ZOFRAN-ODT) 4 MG disintegrating tablet '4mg'$  ODT q4 hours prn nausea/vomit 10/08/22  Yes Deno Etienne, DO  fluconazole (DIFLUCAN) 150 MG tablet Take 1 now and 1 in 3 days 03/08/22   Estill Dooms, NP  fluticasone (FLONASE) 50 MCG/ACT nasal spray Place 1 spray into both nostrils daily for 14 days. 06/23/20 07/07/20  Emerson Monte, FNP  levonorgestrel (MIRENA) 20 MCG/24HR IUD 1 each by Intrauterine route once.    [provider]  metroNIDAZOLE (FLAGYL) 500 MG tablet Take 1 tablet (500 mg total) by mouth 2 (two) times daily. 02/25/22   Estill Dooms, NP      Allergies    Patient has no known allergies.    Review of Systems   Review of Systems  Physical Exam Updated Vital Signs BP 129/83 (BP Location: Right Arm)   Pulse 84   Temp 98.2 F (36.8 C) (Oral)   Resp 20   Ht 5' 5.25" (1.657 m)   Wt 97.5 kg   SpO2 99%   BMI 35.50 kg/m  Physical Exam Vitals and nursing note reviewed.  Constitutional:      General: She is not in acute distress.    Appearance: She is well-developed. She is not diaphoretic.  HENT:     Head: Normocephalic and atraumatic.     Comments: Swollen turbinates, posterior nasal drip, no noted sinus ttp, tm normal bilaterally.   Eyes:     Pupils: Pupils  are equal, round, and reactive to light.  Cardiovascular:     Rate and Rhythm: Normal rate and regular rhythm.     Heart sounds: No murmur heard.    No friction rub. No gallop.  Pulmonary:     Effort: Pulmonary effort is normal.     Breath sounds: No wheezing or rales.  Abdominal:     General: There is no distension.     Palpations: Abdomen is soft.     Tenderness: There is no abdominal tenderness.  Musculoskeletal:        General: No tenderness.     Cervical back: Normal range of motion and neck supple.  Skin:    General: Skin is warm and dry.  Neurological:     Mental Status: She is alert and oriented to person, place, and time.  Psychiatric:        Behavior: Behavior normal.     ED Results / Procedures / Treatments   Labs (all labs ordered are listed, but only abnormal results are displayed) Labs Reviewed - No data to display  EKG None  Radiology No results found.  Procedures Procedures    Medications Ordered in ED Medications - No data to display  ED Course/ Medical Decision Making/  A&P                             Medical Decision Making Risk Prescription drug management.   37 yo F with a cc of cough congestion going on for a couple days.  She is well-appearing nontoxic.  Clear lung sounds without tachypnea no hypoxia I do not feel chest imaging would be beneficial.  This is likely has a viral URI.  No bacterial source was found on exam.  Discharged home PCP follow-up.  8:19 AM:  I have discussed the diagnosis/risks/treatment options with the patient.  Evaluation and diagnostic testing in the emergency department does not suggest an emergent condition requiring admission or immediate intervention beyond what has been performed at this time.  They will follow up with PCP. We also discussed returning to the ED immediately if new or worsening sx occur. We discussed the sx which are most concerning (e.g., sudden worsening pain, fever, inability to tolerate by  mouth) that necessitate immediate return. Medications administered to the patient during their visit and any new prescriptions provided to the patient are listed below.  Medications given during this visit Medications - No data to display   The patient appears reasonably screen and/or stabilized for discharge and I doubt any other medical condition or other [facility] requiring further screening, evaluation, or treatment in the ED at this time prior to discharge.          Final Clinical Impression(s) / ED Diagnoses Final diagnoses:  Viral upper respiratory tract infection    Rx / DC Orders ED Discharge Orders          Ordered    benzonatate (TESSALON) 100 MG capsule  Every 8 hours        10/08/22 0816    ondansetron (ZOFRAN-ODT) 4 MG disintegrating tablet        10/08/22 0816              Deno Etienne, DO 10/08/22 [phone]

## 2022-10-08 NOTE — ED Triage Notes (Signed)
Pt arrived to ED via POV w/ c/o cold s/s: lost voice Sunday, coughing, congestion. Pt in NAD at this time.

## 2022-10-08 NOTE — Discharge Instructions (Signed)
Take tylenol 2 pills 4 times a day and motrin 4 pills 3 times a day.  Drink plenty of fluids.  Return for worsening shortness of breath, headache, confusion. Follow up with your family doctor.   

## 2022-10-08 NOTE — ED Notes (Signed)
Reviewed discharge instructions with patient. Follow-up care and medications reviewed. Patient  verbalized understanding. Patient A&Ox4, VSS, and ambulatory with steady gait upon discharge.  °

## 2023-04-01 ENCOUNTER — Emergency Department (HOSPITAL_COMMUNITY)
Admission: EM | Admit: 2023-04-01 | Discharge: 2023-04-01 | Disposition: A | Discharge status: Home / Self Care | Payer: Managed Care, Other (non HMO) | Attending: Emergency Medicine | Admitting: Emergency Medicine

## 2023-04-01 ENCOUNTER — Emergency Department (HOSPITAL_COMMUNITY): Payer: Managed Care, Other (non HMO)

## 2023-04-01 ENCOUNTER — Encounter (HOSPITAL_COMMUNITY): Payer: Self-pay | Admitting: *Deleted

## 2023-04-01 ENCOUNTER — Other Ambulatory Visit: Payer: Self-pay

## 2023-04-01 DIAGNOSIS — R109 Unspecified abdominal pain: Secondary | ICD-10-CM

## 2023-04-01 DIAGNOSIS — N83202 Unspecified ovarian cyst, left side: Secondary | ICD-10-CM | POA: Insufficient documentation

## 2023-04-01 DIAGNOSIS — R300 Dysuria: Secondary | ICD-10-CM | POA: Diagnosis not present

## 2023-04-01 LAB — PREGNANCY, URINE: Preg Test, Ur: NEGATIVE

## 2023-04-01 LAB — URINALYSIS, ROUTINE W REFLEX MICROSCOPIC
Bilirubin Urine: NEGATIVE
Glucose, UA: NEGATIVE mg/dL
Ketones, ur: NEGATIVE mg/dL
Nitrite: NEGATIVE
Protein, ur: NEGATIVE mg/dL
Specific Gravity, Urine: 1.012 (ref 1.005–1.030)
pH: 6 (ref 5.0–8.0)

## 2023-04-01 MED ORDER — IBUPROFEN 600 MG PO TABS: 600.0000 mg | ORAL_TABLET | Freq: Four times a day (QID) | ORAL | 0 refills | Status: DC | PRN

## 2023-04-01 NOTE — ED Provider Notes (Signed)
[place] EMERGENCY DEPARTMENT AT [facility] Provider Note   CSN: [id number] Arrival date & time: 04/01/23  0845     History  Chief Complaint  Patient presents with   Dysuria    [name] is a 37 y.o. female.  The history is provided by the patient and medical records. No language interpreter was used.  Dysuria    37 year old female with prior history of STI, has IUD, presenting with urinary discomfort.  Patient developed burning urination with trace blood in the urine with increasing frequency and urgency since yesterday.  She also complaining of some discomfort to the left side of her for the same duration.  She overall did not feel well and felt like this is similar to prior UTIs she had a year ago.  Last menstrual period was a month ago.  She did tried to take some Azo with some improvement of her symptoms.  She does not endorse any vaginal discharge.  No history of kidney stone.  No nausea vomiting or diarrhea  Home Medications Prior to Admission medications   Medication Sig Start Date End Date Taking? Authorizing Provider  benzonatate (TESSALON) 100 MG capsule Take 1 capsule (100 mg total) by mouth every 8 (eight) hours. 10/08/22   Melene Plan, DO  fluconazole (DIFLUCAN) 150 MG tablet Take 1 now and 1 in 3 days 03/08/22   Adline Potter, NP  fluticasone [facility]) 50 MCG/ACT nasal spray Place 1 spray into both nostrils daily for 14 days. 06/23/20 07/07/20  Durward Parcel, FNP  levonorgestrel (MIRENA) 20 MCG/24HR IUD 1 each by Intrauterine route once.    [provider]  metroNIDAZOLE (FLAGYL) 500 MG tablet Take 1 tablet (500 mg total) by mouth 2 (two) times daily. 02/25/22   Adline Potter, NP  ondansetron (ZOFRAN-ODT) 4 MG disintegrating tablet 4mg  ODT q4 hours prn nausea/vomit 10/08/22   Melene Plan, DO      Allergies    Patient has no known allergies.    Review of Systems   Review of Systems  Genitourinary:  Positive for dysuria.  All other  systems reviewed and are negative.   Physical Exam Updated Vital Signs BP 119/80 (BP Location: Left Arm)   Pulse 94   Temp 97.8 F (36.6 C) (Oral)   Resp 14   Ht 5\' 5"  (1.651 m)   Wt 99.8 kg   SpO2 99%   BMI 36.61 kg/m  Physical Exam Vitals and nursing note reviewed.  Constitutional:      General: She is not in acute distress.    Appearance: She is well-developed.  HENT:     Head: Atraumatic.  Eyes:     Conjunctiva/sclera: Conjunctivae normal.  Cardiovascular:     Rate and Rhythm: Normal rate and regular rhythm.     Pulses: Normal pulses.     Heart sounds: Normal heart sounds.  Pulmonary:     Effort: Pulmonary effort is normal.  Abdominal:     Palpations: Abdomen is soft.     Tenderness: There is no abdominal tenderness. There is left CVA tenderness. There is no right CVA tenderness.  Musculoskeletal:     Cervical back: Neck supple.  Skin:    Findings: No rash.  Neurological:     Mental Status: She is alert.  Psychiatric:        Mood and Affect: Mood normal.     ED Results / Procedures / Treatments   Labs (all labs ordered are listed, but only abnormal results  are displayed) Labs Reviewed  URINALYSIS, ROUTINE W REFLEX MICROSCOPIC - Abnormal; Notable for the following components:      Result Value   Hgb urine dipstick MODERATE (*)    Leukocytes,Ua SMALL (*)    Bacteria, UA RARE (*)    All other components within normal limits  PREGNANCY, URINE    EKG None  Radiology CT Renal Stone Study  Result Date: 04/01/2023 CLINICAL DATA:  Dysuria and left-sided abdominal pain since yesterday. Blood in urine yesterday. EXAM: CT ABDOMEN AND PELVIS WITHOUT CONTRAST TECHNIQUE: Multidetector CT imaging of the abdomen and pelvis was performed following the standard protocol without IV contrast. RADIATION DOSE REDUCTION: This exam was performed according to the departmental dose-optimization program which includes automated exposure control, adjustment of the mA and/or kV  according to patient size and/or use of iterative reconstruction technique. COMPARISON:  None Available. FINDINGS: Lower chest: Minimal ground-glass along the medial right lung base. No pleural effusion Hepatobiliary: No focal liver abnormality is seen. No gallstones, gallbladder wall thickening, or biliary dilatation. Pancreas: Unremarkable. No pancreatic ductal dilatation or surrounding inflammatory changes. Spleen: Normal in size without focal abnormality. Adrenals/Urinary Tract: Adrenal glands are unremarkable. Kidneys are normal, without renal calculi, focal lesion, or hydronephrosis. Bladder is unremarkable. Stomach/Bowel: Stomach is nondilated. Small bowel is nondilated. The large bowel has a normal course and caliber with diffuse mild-to-moderate stool. Normal appendix seen extending posteromedial in the right hemipelvis. Vascular/Lymphatic: No significant vascular findings are present. No enlarged abdominal or pelvic lymph nodes. Reproductive: IUD in the uterus. No separate adnexal mass. Only a simple appearing cysts in the left ovary measuring 2.3 cm by noncontrast CT. No follow-up imaging recommended. Note: This recommendation does not apply to premenarchal patients and to those with increased risk (genetic, family history, elevated tumor markers or other high-risk factors) of ovarian cancer. Reference: JACR 2020 Feb; 17(2):248-254 Other: No free air or free fluid. Small fat containing umbilical hernia. Musculoskeletal: Small sclerotic focus identified along the left iliac bone measuring 11 mm. This has relatively benign features. IMPRESSION: Moderate colonic stool.  No bowel obstruction.  Normal appendix. No obstructing renal stone. IUD. 11 mm sclerotic focus along the left iliac bone. Favor a benign process but indeterminate. Please correlate for any location of pain in this area. If needed additional bone scan workup versus short-term follow-up Electronically Signed   By: Karen Kays M.D.   On:  04/01/2023 12:15    Procedures Procedures    Medications Ordered in ED Medications - No data to display  ED Course/ Medical Decision Making/ A&P                                 Medical Decision Making Amount and/or Complexity of Data Reviewed Labs: ordered. Radiology: ordered.   BP 119/80 (BP Location: Left Arm)   Pulse 94   Temp 97.8 F (36.6 C) (Oral)   Resp 14   Ht 5\' 5"  (1.651 m)   Wt 99.8 kg   SpO2 99%   BMI 36.61 kg/m   16:41 AM  37 year old female with prior history of STI, has IUD, presenting with urinary discomfort.  Patient developed burning urination with trace blood in the urine with increasing frequency and urgency since yesterday.  She also complaining of some discomfort to the left side of her for the same duration.  She overall did not feel well and felt like this is similar to prior UTIs she had  a year ago.  Last menstrual period was a month ago.  She did tried to take some Azo with some improvement of her symptoms.  She does not endorse any vaginal discharge.  No history of kidney stone.  No nausea vomiting or diarrhea  On exam this is a well-appearing female resting comfortably in bed appears to be in no acute discomfort.  Heart with normal rate and rhythm, lungs are clear to auscultation bilaterally abdomen is soft nontender patient has some mild tenderness to left flank on percussion.  Vital signs overall reassuring no fever no hypoxia.  -Labs ordered, independently viewed and interpreted by me.  Labs remarkable for UA showing moderate Hgb.  Preg test negative.   -The patient was maintained on a cardiac monitor.  I personally viewed and interpreted the cardiac monitored which showed an underlying rhythm of: NSR -Imaging independently viewed and interpreted by me and I agree with radiologist's interpretation.  Result remarkable for abd/pelvis CT showing no kidney stone. However, pt does have a simple appearing ovarian cyst in the left ovary 2.3cm.  small  sclerotic focus on the left iliac bone measuring 11mm.  I discussed this with pt. -This patient presents to the ED for concern of dysuria, this involves an extensive number of treatment options, and is a complaint that carries with it a high risk of complications and morbidity.  The differential diagnosis includes UTI, pyelonephritis, kidney stone, ovarian cyst -Co morbidities that complicate the patient evaluation includes abnormal pap smear -Treatment offered, pt declined -Reevaluation of the patient after these medicines showed that the patient stayed the same -PCP office notes or outside notes reviewed -Escalation to admission/observation considered: patients feels much better, is comfortable with discharge, and will follow up with PCP -Prescription medication considered, patient comfortable with ibuprofen -Social Determinant of Health considered which includes food insecurity         Final Clinical Impression(s) / ED Diagnoses Final diagnoses:  Left flank pain  Cyst of left ovary    Rx / DC Orders ED Discharge Orders          Ordered    ibuprofen (ADVIL) 600 MG tablet  Every 6 hours PRN        04/01/23 1403              Fayrene Helper, PA-C 04/01/23 1404    Terrilee Files, MD 04/01/23 1815

## 2023-04-01 NOTE — ED Triage Notes (Signed)
Pt c/o dysuria and pain to left upper side since yesterday. Denies pain. Pt took AZO Standard yesterday with relief in pain.

## 2023-04-01 NOTE — Discharge Instructions (Signed)
You have been evaluated for your symptoms.  Your urine did show some evidence of blood but no obvious signs of urinary tract infection.  You do have an ovarian cyst on the left side which may cause discomfort.  You also have an incidental finding of a small sclerotic lesion in your left iliac bone measuring approximately 11 mm.  This is likely a benign finding.  You may follow-up with your doctor for further care if you have concern.  Take ibuprofen as needed for your symptoms and return to ER if you have any concern.

## 2023-04-06 ENCOUNTER — Encounter: Payer: Self-pay | Admitting: Obstetrics & Gynecology

## 2023-04-06 ENCOUNTER — Other Ambulatory Visit (HOSPITAL_COMMUNITY)
Admission: RE | Admit: 2023-04-06 | Discharge: 2023-04-06 | Disposition: A | Discharge status: Home / Self Care | Payer: Managed Care, Other (non HMO) | Source: Ambulatory Visit | Attending: Obstetrics & Gynecology | Admitting: Obstetrics & Gynecology

## 2023-04-06 ENCOUNTER — Ambulatory Visit (INDEPENDENT_AMBULATORY_CARE_PROVIDER_SITE_OTHER): Payer: Managed Care, Other (non HMO) | Admitting: Obstetrics & Gynecology

## 2023-04-06 DIAGNOSIS — R102 Pelvic and perineal pain: Secondary | ICD-10-CM | POA: Diagnosis not present

## 2023-04-06 DIAGNOSIS — T8332XD Displacement of intrauterine contraceptive device, subsequent encounter: Secondary | ICD-10-CM | POA: Diagnosis not present

## 2023-04-06 DIAGNOSIS — N898 Other specified noninflammatory disorders of vagina: Secondary | ICD-10-CM

## 2023-04-06 MED ORDER — NUVESSA 1.3 % VA GEL: 1.0000 | Freq: Once | VAGINAL | 0 refills | Status: AC

## 2023-04-06 NOTE — Progress Notes (Signed)
   GYN VISIT Patient name: [name] MRN [id number]  Date of birth: 14-Aug-1985 Chief Complaint:   Follow-up (On ER visit)  History of Present Illness:   [name] is a 37 y.o. [phone] female being seen today for the following concerns:  Last week noted burning with urination, vaginal itching, pain on left side and pelvic pain.  Took ibuprofen with some relief.   Went to ER imaging was completed- noted to have a 2.7cm right ovarian cyst.  IUD in proper location.  Notes bleeding and pain with intercourse.  She does not have regular period, only spotting every now and then.  Today she reports that her symptoms have completely resolved.  She is no longer having pelvic pain.  She is concerned that maybe the IUD is causing some issues and is considering removal.  The IUD was initially placed in 2020, overall she has been happy with the device and she does not currently desire another pregnancy  No LMP recorded. (Menstrual status: IUD).  Last pap 02/2022     Review of Systems:   Pertinent items are noted in HPI Denies fever/chills, dizziness, headaches, visual disturbances, fatigue, shortness of breath, chest pain, abdominal pain, vomiting Pertinent History Reviewed:   Past Surgical History:  Procedure Laterality Date   NO PAST SURGERIES      Past Medical History:  Diagnosis Date   Abnormal Pap smear    Breast nodule 08/23/2014   Infection    HSV2   Trichimoniasis 04/07/2020   Vaginal Pap smear, abnormal    Reviewed problem list, medications and allergies. Physical Assessment:  There were no vitals filed for this visit.There is no height or weight on file to calculate BMI.       Physical Examination:   General appearance: alert, well appearing, and in no distress  Psych: mood appropriate, normal affect  Skin: warm & dry   Cardiovascular: normal heart rate noted  Respiratory: normal respiratory effort, no distress  Abdomen: soft, non-tender, no rebound, no guarding, no  reproducible pain  Pelvic: VULVA: normal appearing vulva with no masses, tenderness or lesions, VAGINA: normal appearing vagina with frothy white discharge noted, CERVIX: normal appearing cervix without discharge or lesions, strings not visualized  Extremities: no edema   Chaperone: Faith Rogue    Assessment & Plan:  1) vaginal irritation/pelvic pain -Findings concerning for bacterial vaginosis, repeat panel collected today -Notes reaction to Flagyl-it sounds like she was prescribed both vaginal and oral treatment.  She noted vulvar swelling and a yeast infection follow treatment -Will trial Dolores Frame, if insurance does not cover patient to let us know  2) Lost IUD strings -Confirmed proper location by ultrasound -Should she desire removal we will plan for preop medication in office and briefly discussed possible need for hysteroscopy   No orders of the defined types were placed in this encounter.   Return in about 1 year (around 04/05/2024), or if symptoms worsen or fail to improve.   Myna Hidalgo, DO Attending Obstetrician & Gynecologist, [facility] for [employer], [facility]

## 2023-04-06 NOTE — Addendum Note (Signed)
Addended by: Annamarie Dawley on: 04/06/2023 04:46 PM   Modules accepted: Orders

## 2023-04-07 ENCOUNTER — Telehealth: Payer: Self-pay | Admitting: Obstetrics & Gynecology

## 2023-04-07 NOTE — Telephone Encounter (Signed)
Attempted to call patient x 2.

## 2023-04-07 NOTE — Telephone Encounter (Signed)
Pt states the prescription prescribed is too expensive. Please advise

## 2023-04-14 ENCOUNTER — Other Ambulatory Visit: Payer: Self-pay | Admitting: Obstetrics & Gynecology

## 2023-04-14 DIAGNOSIS — N760 Acute vaginitis: Secondary | ICD-10-CM

## 2023-04-14 LAB — CERVICOVAGINAL ANCILLARY ONLY
Bacterial Vaginitis (gardnerella): POSITIVE — AB
Candida Glabrata: NEGATIVE
Candida Vaginitis: NEGATIVE
Comment: NEGATIVE
Comment: NEGATIVE
Comment: NEGATIVE
Comment: NEGATIVE
Trichomonas: NEGATIVE

## 2023-04-14 MED ORDER — NUVESSA 1.3 % VA GEL: 1.0000 | Freq: Once | VAGINAL | 2 refills | Status: AC

## 2024-06-06 ENCOUNTER — Ambulatory Visit: Admitting: Women's Health

## 2024-07-02 ENCOUNTER — Ambulatory Visit: Admitting: Obstetrics & Gynecology

## 2024-07-30 ENCOUNTER — Other Ambulatory Visit (HOSPITAL_COMMUNITY)
Admission: RE | Admit: 2024-07-30 | Discharge: 2024-07-30 | Disposition: A | Discharge status: Home / Self Care | Payer: Self-pay | Source: Ambulatory Visit | Attending: Advanced Practice Midwife | Admitting: Advanced Practice Midwife

## 2024-07-30 ENCOUNTER — Ambulatory Visit: Admitting: Advanced Practice Midwife

## 2024-07-30 ENCOUNTER — Encounter: Payer: Self-pay | Admitting: Advanced Practice Midwife

## 2024-07-30 VITALS — BP 114/76 | HR 96 | Ht 65.0 in | Wt 249.0 lb

## 2024-07-30 DIAGNOSIS — Z30432 Encounter for removal of intrauterine contraceptive device: Secondary | ICD-10-CM | POA: Insufficient documentation

## 2024-07-30 DIAGNOSIS — Z01419 Encounter for gynecological examination (general) (routine) without abnormal findings: Secondary | ICD-10-CM

## 2024-07-30 DIAGNOSIS — Z113 Encounter for screening for infections with a predominantly sexual mode of transmission: Secondary | ICD-10-CM

## 2024-07-30 DIAGNOSIS — Z1331 Encounter for screening for depression: Secondary | ICD-10-CM | POA: Diagnosis not present

## 2024-07-30 NOTE — Progress Notes (Signed)
 [name] 38 y.o.  Vitals:   07/30/24 1201  BP: 114/76  Pulse: 96     Filed Weights   07/30/24 1201  Weight: 249 lb (112.9 kg)    Past Medical History: Past Medical History:  Diagnosis Date   Abnormal Pap smear    Breast nodule 08/23/2014   Infection    HSV2   Trichimoniasis 04/07/2020   Vaginal Pap smear, abnormal     Past Surgical History: Past Surgical History:  Procedure Laterality Date   NO PAST SURGERIES      Family History: Family History  Problem Relation Age of Onset   Cancer Paternal Grandmother        breast   Hypertension Mother    Heart disease Mother    Hypertension Sister    Diabetes Maternal Grandmother    Kidney disease Maternal Grandmother        kidney failure   Cancer Paternal Grandfather        lung   Cancer Paternal Aunt        breast    Social History: Social History[1]  Allergies: Allergies[2]   Current Medications[3]      07/30/2024   12:13 PM 02/22/2022   11:47 AM 11/05/2020    2:47 PM 06/29/2017    2:20 PM  Depression screen PHQ 2/9  Decreased Interest 0 0 1 1  Down, Depressed, Hopeless 0 2 1 1   PHQ - 2 Score 0 2 2 2   Altered sleeping 0 2 1 1   Tired, decreased energy 0 2 0 1  Change in appetite 0 0 0 0  Feeling bad or failure about yourself  0 0 0 0  Trouble concentrating 0 0 0 0  Moving slowly or fidgety/restless 0 0 0 0  Suicidal thoughts 0 0 0 0  PHQ-9 Score 0 6  3  4       Data saved with a previous flowsheet row definition        07/30/2024   12:13 PM 02/22/2022   11:47 AM 11/05/2020    2:48 PM  GAD 7 : Generalized Anxiety Score  Nervous, Anxious, on Edge 0 0 0  Control/stop worrying 0 0 0  Worry too much - different things 0 1 1  Trouble relaxing 0 0 0  Restless 0 0 0  Easily annoyed or irritable 0 1 1  Afraid - awful might happen 0 0 0  Total GAD 7 Score 0 2 2      Mental Health score  Negative Follow up:  N/A   Upstream - 07/30/24 1216       Pregnancy Intention Screening   Does the  patient want to become pregnant in the next year? No    Does the patient's partner want to become pregnant in the next year? No    Would the patient like to discuss contraceptive options today? No      Contraception Wrap Up   Current Method IUD or IUS    End Method IUD or IUS    Contraception Counseling Provided No          The pregnancy intention screening data noted above was reviewed.   History of Present Illness: Here for annual visit.  Uses Mirena  IUD for birth control.   Bleeding is normal. However ,she is losing some hair, has weird pelvic pain at times, wants to remove IUD and use condoms for a while.    Prior cytology:      Component Value Date/Time  DIAGPAP  02/22/2022 1151    - Negative for Intraepithelial Lesions or Malignancy (NILM)   DIAGPAP - Benign reactive/reparative changes 02/22/2022 1151   DIAGPAP (A) 11/05/2020 1459    - Atypical squamous cells of undetermined significance (ASC-US )   HPVHIGH Negative 02/22/2022 1151   HPVHIGH Positive (A) 11/05/2020 1459   ADEQPAP  02/22/2022 1151    Satisfactory for evaluation; transformation zone component PRESENT.   ADEQPAP  11/05/2020 1459    Satisfactory for evaluation; transformation zone component PRESENT.   ADEQPAP Satisfactory for evaluation. 06/29/2017 0000      Review of Systems   Patient denies any headaches, blurred vision, shortness of breath, chest pain, abdominal pain, problems with bowel movements, urination, or intercourse.   Physical Exam: General:  Well developed, well nourished, no acute distress Skin:  Warm and dry Neck:  Midline trachea Lungs; Clear to auscultation bilaterally Breast:  No dominant palpable mass, retraction, or nipple discharge Cardiovascular: Regular rate and rhythm Abdomen:  Soft, non tender, no hepatosplenomegaly Pelvic:  External genitalia is normal in appearance.  The vagina is normal in appearance.  The cervix is bulbous.  Uterus is felt to be normal size, shape, and  contour.  No adnexal masses or tenderness noted.IUD strings not visible,  Curved Kelly inserted into os and IUD grasped w/2nd attempt and easily removed. Extremities:  No swelling or varicosities noted  Chaperone: Alan Fischer  Impression: Normal GYN exam   1. Encounter for well woman exam with routine gynecological exam (Primary)      Plan=Pap due next year Plans condoms (knows about plan B ) fornow           [1]  Social History Tobacco Use   Smoking status: Former    Current packs/day: 0.00    Average packs/day: 0.5 packs/day for 2.0 years (1.0 ttl pk-yrs)    Types: Cigarettes    Start date: 11/13/2016    Quit date: 11/14/2018    Years since quitting: 5.7   Smokeless tobacco: Never  Vaping Use   Vaping status: Never Used  Substance Use Topics   Alcohol use: Not Currently    Comment: Occasional   Drug use: No  [2] No Known Allergies [3]  Current Outpatient Medications:    levonorgestrel  (MIRENA ) 20 MCG/24HR IUD, 1 each by Intrauterine route once., Disp: , Rfl:

## 2024-07-31 LAB — CERVICOVAGINAL ANCILLARY ONLY
Chlamydia: NEGATIVE
Comment: NEGATIVE
Comment: NEGATIVE
Comment: NORMAL
Neisseria Gonorrhea: NEGATIVE
Trichomonas: NEGATIVE

## 2024-08-13 ENCOUNTER — Encounter: Payer: Self-pay | Admitting: Advanced Practice Midwife
